# Patient Record
Sex: Male | Born: 1935 | Race: White | Hispanic: No | Marital: Married | Smoking: Never smoker
Health system: Southern US, Community
[De-identification: ages and names within clinical notes are randomized; demographics above are authoritative.]

## PROBLEM LIST (undated history)

## (undated) DIAGNOSIS — I2510 Atherosclerotic heart disease of native coronary artery without angina pectoris: Secondary | ICD-10-CM

## (undated) DIAGNOSIS — S3600XA Unspecified injury of spleen, initial encounter: Secondary | ICD-10-CM

## (undated) DIAGNOSIS — E785 Hyperlipidemia, unspecified: Secondary | ICD-10-CM

## (undated) DIAGNOSIS — E7800 Pure hypercholesterolemia, unspecified: Secondary | ICD-10-CM

## (undated) DIAGNOSIS — I10 Essential (primary) hypertension: Secondary | ICD-10-CM

## (undated) HISTORY — PX: CORONARY STENT PLACEMENT: SHX1402

## (undated) HISTORY — DX: Atherosclerotic heart disease of native coronary artery without angina pectoris: I25.10

## (undated) HISTORY — DX: Pure hypercholesterolemia, unspecified: E78.00

## (undated) HISTORY — PX: THYROID SURGERY: SHX805

## (undated) HISTORY — PX: CHOLECYSTECTOMY: SHX55

---

## 1999-06-12 ENCOUNTER — Inpatient Hospital Stay (HOSPITAL_COMMUNITY): Admission: EM | Admit: 1999-06-12 | Discharge: 1999-06-14 | Payer: Self-pay | Admitting: Emergency Medicine

## 1999-06-12 ENCOUNTER — Encounter: Payer: Self-pay | Admitting: Emergency Medicine

## 1999-12-14 ENCOUNTER — Inpatient Hospital Stay (HOSPITAL_COMMUNITY): Admission: RE | Admit: 1999-12-14 | Discharge: 1999-12-15 | Payer: Self-pay | Admitting: Cardiology

## 2000-12-26 ENCOUNTER — Encounter: Admission: RE | Admit: 2000-12-26 | Discharge: 2000-12-26 | Payer: Self-pay | Admitting: Internal Medicine

## 2000-12-26 ENCOUNTER — Encounter: Payer: Self-pay | Admitting: Internal Medicine

## 2001-02-22 ENCOUNTER — Ambulatory Visit (HOSPITAL_COMMUNITY): Admission: RE | Admit: 2001-02-22 | Discharge: 2001-02-22 | Payer: Self-pay | Admitting: *Deleted

## 2001-02-22 ENCOUNTER — Encounter (INDEPENDENT_AMBULATORY_CARE_PROVIDER_SITE_OTHER): Payer: Self-pay | Admitting: Specialist

## 2001-04-30 ENCOUNTER — Ambulatory Visit (HOSPITAL_COMMUNITY): Admission: RE | Admit: 2001-04-30 | Discharge: 2001-04-30 | Payer: Self-pay | Admitting: *Deleted

## 2002-06-04 ENCOUNTER — Encounter: Payer: Self-pay | Admitting: Otolaryngology

## 2002-06-04 ENCOUNTER — Encounter: Admission: RE | Admit: 2002-06-04 | Discharge: 2002-06-04 | Payer: Self-pay | Admitting: Otolaryngology

## 2002-06-23 ENCOUNTER — Encounter (HOSPITAL_COMMUNITY): Admission: RE | Admit: 2002-06-23 | Discharge: 2002-09-21 | Payer: Self-pay | Admitting: Otolaryngology

## 2002-06-24 ENCOUNTER — Encounter: Payer: Self-pay | Admitting: Otolaryngology

## 2002-07-23 ENCOUNTER — Encounter: Payer: Self-pay | Admitting: Otolaryngology

## 2002-07-23 ENCOUNTER — Encounter (INDEPENDENT_AMBULATORY_CARE_PROVIDER_SITE_OTHER): Payer: Self-pay | Admitting: *Deleted

## 2002-07-23 ENCOUNTER — Ambulatory Visit (HOSPITAL_COMMUNITY): Admission: RE | Admit: 2002-07-23 | Discharge: 2002-07-23 | Payer: Self-pay | Admitting: Otolaryngology

## 2004-06-16 ENCOUNTER — Ambulatory Visit (HOSPITAL_COMMUNITY): Admission: RE | Admit: 2004-06-16 | Discharge: 2004-06-16 | Payer: Self-pay | Admitting: *Deleted

## 2004-06-16 ENCOUNTER — Encounter (INDEPENDENT_AMBULATORY_CARE_PROVIDER_SITE_OTHER): Payer: Self-pay | Admitting: *Deleted

## 2005-03-01 ENCOUNTER — Encounter: Admission: RE | Admit: 2005-03-01 | Discharge: 2005-03-01 | Payer: Self-pay | Admitting: Family Medicine

## 2005-11-20 ENCOUNTER — Ambulatory Visit (HOSPITAL_COMMUNITY): Admission: RE | Admit: 2005-11-20 | Discharge: 2005-11-20 | Payer: Self-pay | Admitting: Family Medicine

## 2006-12-18 ENCOUNTER — Ambulatory Visit (HOSPITAL_COMMUNITY): Admission: RE | Admit: 2006-12-18 | Discharge: 2006-12-18 | Payer: Self-pay | Admitting: *Deleted

## 2007-02-18 ENCOUNTER — Ambulatory Visit (HOSPITAL_COMMUNITY): Admission: RE | Admit: 2007-02-18 | Discharge: 2007-02-18 | Payer: Self-pay | Admitting: *Deleted

## 2007-02-19 ENCOUNTER — Ambulatory Visit (HOSPITAL_COMMUNITY): Admission: RE | Admit: 2007-02-19 | Discharge: 2007-02-19 | Payer: Self-pay | Admitting: Family Medicine

## 2007-03-25 ENCOUNTER — Emergency Department (HOSPITAL_COMMUNITY): Admission: EM | Admit: 2007-03-25 | Discharge: 2007-03-25 | Payer: Self-pay | Admitting: Emergency Medicine

## 2007-03-27 ENCOUNTER — Ambulatory Visit (HOSPITAL_COMMUNITY): Admission: RE | Admit: 2007-03-27 | Discharge: 2007-03-27 | Payer: Self-pay | Admitting: Gastroenterology

## 2007-04-04 ENCOUNTER — Inpatient Hospital Stay (HOSPITAL_COMMUNITY): Admission: AD | Admit: 2007-04-04 | Discharge: 2007-04-09 | Payer: Self-pay | Admitting: Gastroenterology

## 2007-04-07 ENCOUNTER — Encounter (INDEPENDENT_AMBULATORY_CARE_PROVIDER_SITE_OTHER): Payer: Self-pay | Admitting: General Surgery

## 2007-04-09 ENCOUNTER — Encounter (INDEPENDENT_AMBULATORY_CARE_PROVIDER_SITE_OTHER): Payer: Self-pay | Admitting: Interventional Radiology

## 2007-05-01 ENCOUNTER — Observation Stay (HOSPITAL_COMMUNITY): Admission: RE | Admit: 2007-05-01 | Discharge: 2007-05-02 | Payer: Self-pay | Admitting: Otolaryngology

## 2007-05-01 ENCOUNTER — Encounter (INDEPENDENT_AMBULATORY_CARE_PROVIDER_SITE_OTHER): Payer: Self-pay | Admitting: Otolaryngology

## 2008-06-03 ENCOUNTER — Encounter: Admission: RE | Admit: 2008-06-03 | Discharge: 2008-06-03 | Payer: Self-pay | Admitting: Gastroenterology

## 2008-11-22 IMAGING — CR DG ABDOMEN ACUTE W/ 1V CHEST
4 series · 4 of 4 positions shown · non-contrast
Comparison: None.

CLINICAL DATA: Abdominal pain

[w chest pa]
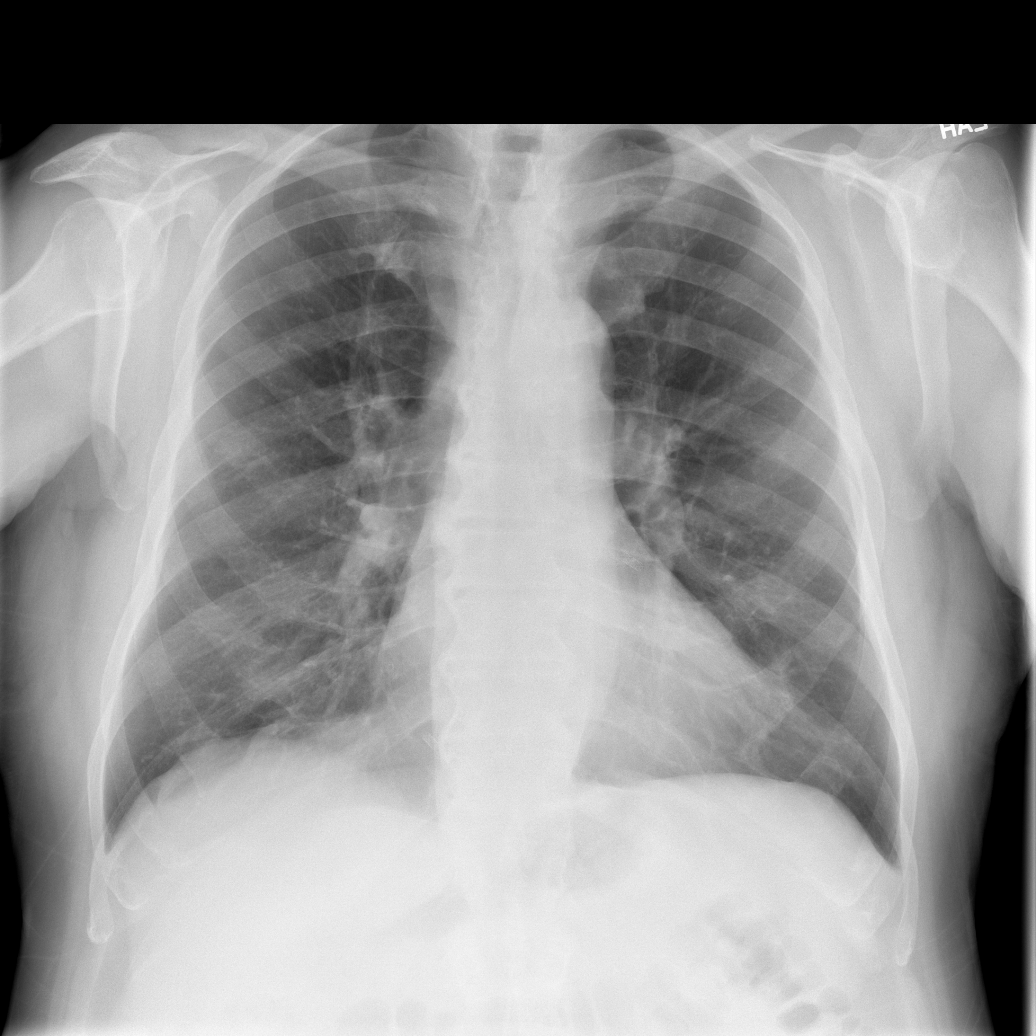

[w abdomen upright]
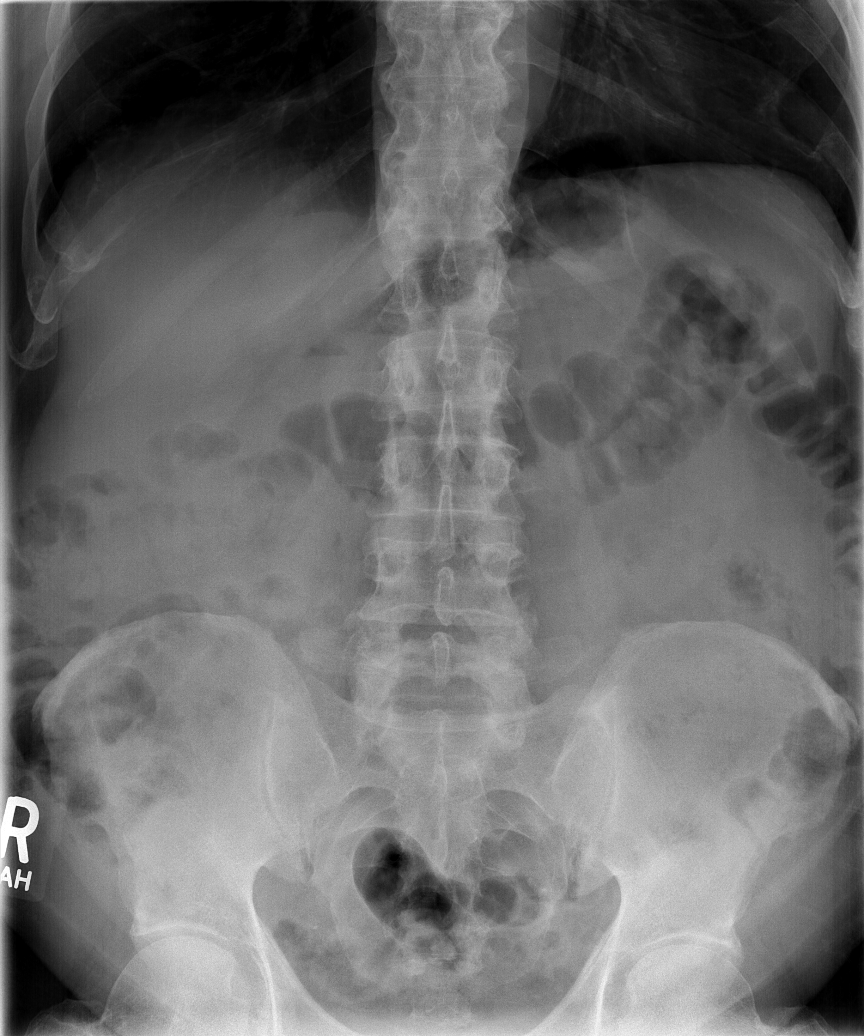

[t abdomen supine (1 of 2)]
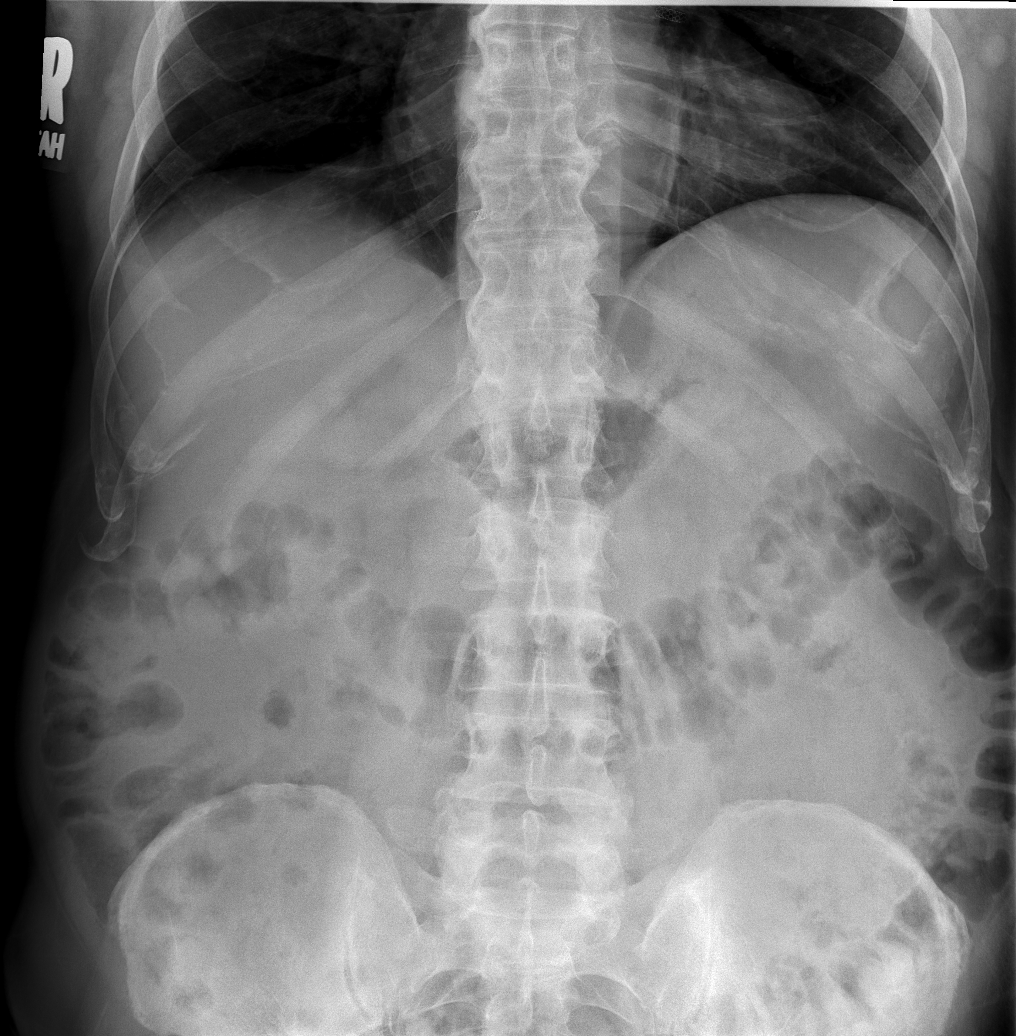

[t abdomen supine (2 of 2)]
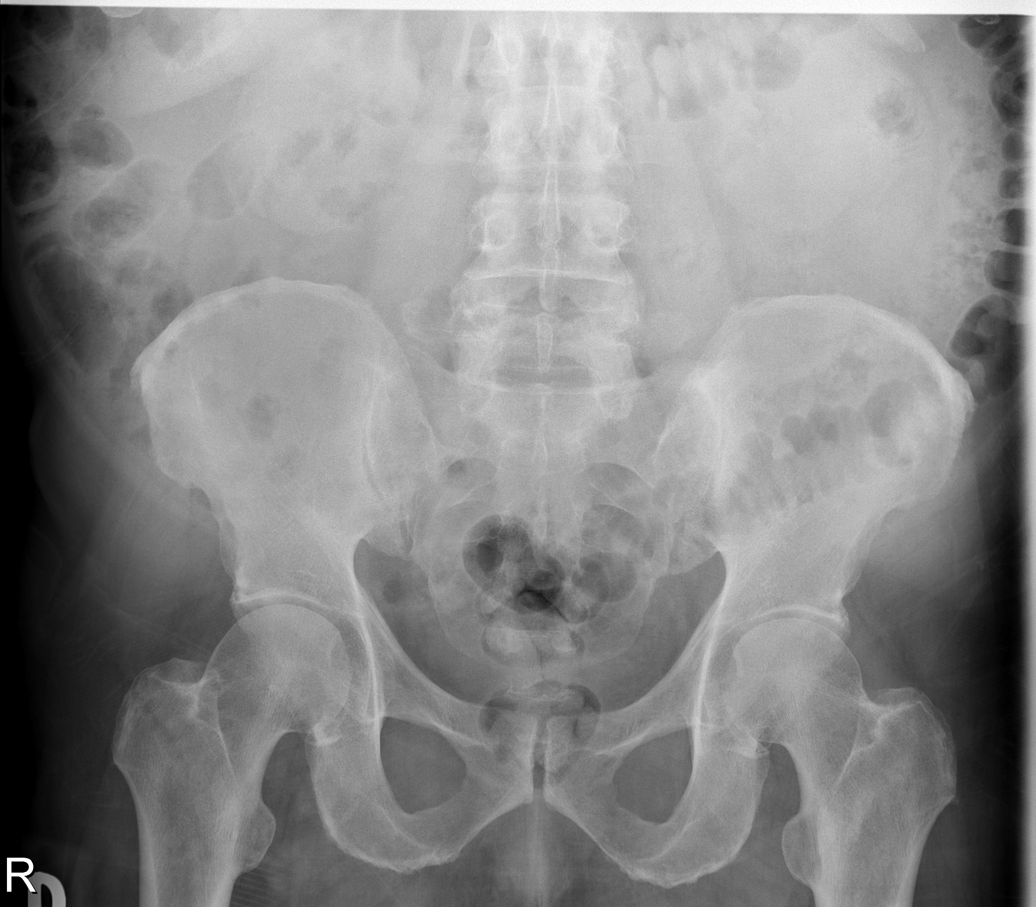

[4 of 4 positions shown; findings below may reference images not displayed]

ABDOMEN SERIES - 2 VIEW AND CHEST - 1 VIEW:

Frontal chest shows a subtle nodular density over the right midlung which is
probably a confluence of shadows. The cardiopericardial silhouette is within
normal limits for size. The lungs are without focal infiltrate, edema or pleural
effusion.

Supine and upright views of the abdomen are without evidence of intraperitoneal
free air. There is no gaseous bowel dilation suggest obstruction. Air is
scattered along the entire length of a nondilated colon. Mild degenerative
changes are evident in lower lumbar spine.
IMPRESSION: Subtle nodular density in the right midlung is probably confluence of shadows. A
followup short-term 2 view chest x-ray is recommended to further evaluate.

Nonspecific bowel gas pattern.

## 2009-10-14 ENCOUNTER — Encounter: Admission: RE | Admit: 2009-10-14 | Discharge: 2009-10-14 | Payer: Self-pay | Admitting: Family Medicine

## 2010-07-31 ENCOUNTER — Encounter: Payer: Self-pay | Admitting: Family Medicine

## 2010-08-01 ENCOUNTER — Encounter: Payer: Self-pay | Admitting: Gastroenterology

## 2010-08-01 ENCOUNTER — Encounter: Payer: Self-pay | Admitting: Otolaryngology

## 2010-10-31 ENCOUNTER — Other Ambulatory Visit: Payer: Self-pay | Admitting: Gastroenterology

## 2010-11-22 NOTE — H&P (Signed)
[name], BARICH NO.:  [id number]   MEDICAL RECORD NO.:  [id number]          PATIENT TYPE:  OBV   LOCATION:  2899                         FACILITY:  [facility]   PHYSICIAN:  Hermelinda Medicus, M.D.   DATE OF BIRTH:  [birth year]   DATE OF ADMISSION:  05/01/2007  DATE OF DISCHARGE:  05/02/2007                              HISTORY & PHYSICAL   This patient is a 77 year old male who was admitted recently to the  hospital for nausea and slight difficulty in swallowing.  He has been  followed by Dr. Madilyn Fireman and ultimately was seen because of also  considerable abdominal pain and underwent a cholecystectomy and did very  well.  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  He also  had a thyroid evaluation when he was there and his TSH was normal at 0.6  but he has had some right-sided discomfort and also the left sided  discomfort and he has had also the hoarseness to his voice which he has  kind of attributed just to his aging.  He has a moderate size swelling  in his neck but this was really not very obvious.  However, he had a CAT  scan of his neck which showed a lesion pressing on the trachea, inferior  thyroid, left, greatest diameter of 4.6 cm.  He had also had an  ultrasound done which showed similar situation and the biopsy was one of  follicular lesion.  He had workup in 2003 and decided to do nothing at  that time but now, with these increased symptoms and increased size of  this, the patient has accepted the fact that we need to remove this  lesion.  He now enters for a left subtotal thyroidectomy.   PAST HISTORY:  Is one really quite unremarkable except for the above-  mentioned stent and the history of reflux esophagitis and cardiovascular  issues.  He has also had the cholecystectomy.  He has also had this  thyroid mass on the left side.  He has also had some choking and nausea  that has been evaluated by Dr. Madilyn Fireman.   MEDICATION LIST:  Is in the chart.   PHYSICAL EXAMINATION:  Reveals a blood pressure of 110/60.  EARS:  Are clear, tympanic membranes are clear and move well.  NOSE:  Is clear.  NASOPHARYNX:  Is also clear.  LARYNX:  Is free of any ulceration or mass.  His vocal cord mobility  just seems a little delayed in this 77 year old man.  Both vocal cords,  however, are moving but he is somewhat hoarse.  His true cords, false  cords, epiglottis, base of tongue, lateral pharyngeal walls, nasopharynx  are all clear of ulceration or masses.  There is no evidence of any  sinus infection, no evidence of any sinusitis.  NECK:  Is free of any thyroid hypertrophy.  The  thyroid is really not  palpable.  The carotids are not abnormal and there is no cervical  adenopathy.  Posterior triangles also clear.  SCALP/SKIN:  Are clear.  LUNGS:  Are clear.  No rales, rhonchi or wheezes.  HEART:  No opening snaps, murmurs or gallops. The EKG is within normal  limits.  ABDOMEN:  No hepatosplenomegaly.  He did have the right upper quadrant  tenderness leading to his gallbladder surgery but this is resolving.  EXTREMITIES:  Are normal.   DIAGNOSES:  1. 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  2. Status post cholecystectomy and status post stents placed, left      anterior descending, right coronary artery.  3. History of ulcer disease.  4. History of hyperlipidemia.  5. Hypertension.           ______________________________  Hermelinda Medicus, M.D.     JC/MEDQ  D:  05/01/2007   T:  05/02/2007  Job:  [id number]   cc:   Reather Littler, M.D.  John C. Madilyn Fireman, M.D.  [facility], Dr. Arlana Pouch A. Nicholos Johns, M.D.

## 2010-11-22 NOTE — Op Note (Signed)
NAMEJEYREN, DANOWSKI NO.:  [id number]   MEDICAL RECORD NO.:  [id number]          PATIENT TYPE:  INP   LOCATION:  6743                         FACILITY:  [facility]   PHYSICIAN:  Sharlet Salina T. Hoxworth, M.D.DATE OF BIRTH:  Dec 03, 1928   DATE OF PROCEDURE:  04/07/2007  DATE OF DISCHARGE:                               OPERATIVE REPORT   PREOPERATIVE DIAGNOSIS:  Chronic cholecystitis/biliary dyskinesia.   POSTOPERATIVE DIAGNOSIS:  Chronic cholecystitis slash biliary  dyskinesia.   SURGICAL PROCEDURES:  Laparoscopic cholecystectomy with intraoperative  cholangiogram.   SURGEON:  Sharlet Salina T. Hoxworth, M.D.   ANESTHESIA:  General.   BRIEF HISTORY:  Mr. Pettry is a 77 year old male who presents with  several weeks to months of gradually worsening right upper quadrant  abdominal pain and nausea.  He has had a very extensive workup, which  has failed to reveal any cause for his pain.  He is clinically suspected  to have chronic cholecystitis or biliary dyskinesia.  After discussion  with the patient's family, they have elected to proceed with  laparoscopic cholecystectomy with cholangiogram, in an effort to relieve  his symptoms.  The nature of the procedure, its indications, risks of  cardiorespiratory complications, bleeding, infection, bile leak, bile  duct injury and possible failure to relieve her symptoms have been  discussed and understood.  The patient is now brought to the operating  room for this procedure.   DESCRIPTION OF OPERATION:  The patient was brought to the operating room  and placed in the supine position on the operating room table.  General  endotracheal anesthesia was induced.  The abdomen was widely and  sterilely prepped and draped.  He received preoperative antibiotics.  PAS were placed.  Correct patient and procedure were verified.  Local  anesthesia was used to infiltrate the trocar sites prior to the  incisions.   A 1 cm incision was made  above the umbilicus in the midline.  The  dissection was carried down to the midline fascia, which was sharply  incised for 1 cm, and the peritoneum entered under direct vision.  Through a mattress sutures of )0 Vicryl, the Hasson trocar was placed  and pneumoperitoneum established.  Under direct vision, an 11 mm trocar  was placed in the subxiphoid area, and two 5 mm trocars along the right  subcostal margin.   The gallbladder was visualized and did not appear severely inflamed; but  was quite distended, large and possibly a little thickened.  The fundus  grasped and elevated up over the liver, and the infundibulum retracted  inferolaterally.  Fibrofatty tissue was stripped off the neck of the  gallbladder toward the porta hepatis.  The cystic artery was clearly  identified coursing up into the gallbladder wall; it was divided between  the two proximal windows to clip.  The distal gallbladder was further  dissected; this was closed triangle, and the cystic duct gallbladder  junction identified.  The cystic duct dissected out over about a  centimeter.  When the anatomy appeared clear, the cystic duct was  clipped at the gallbladder junction and an operative cholangiogram  obtained  through the cystic duct.  This showed good filling of normal  common bile duct and intrahepatic ducts, with free flow into the  duodenum and no filling defects.  Following this, the cholangiocath was  removed and the cystic duct was doubly clipped proximally and divided.  The gallbladder was then dissected free from its bed using hook cautery  and removed through the umbilical incision.  The right upper quadrant  was irrigated and complete hemostasis assured.  Trocars were removed and  all CO2 evacuated.  The mattress suture was secured at the umbilicus.  Skin incisions were closed with interrupted subcuticular Monocryl and  Dermabond.   COUNTS:  Sponge, needle and instrument counts were correct.    DISPOSITION:  The patient was taken to the recovery room in good  condition.      Lorne Skeens. Hoxworth, M.D.  Electronically Signed     BTH/MEDQ  D:  04/07/2007  T:  04/07/2007  Job:  [id number]

## 2010-11-22 NOTE — Discharge Summary (Signed)
[name]            ACCOUNT NO.:  [id number]   MEDICAL RECORD NO.:  [id number]          PATIENT TYPE:  INP   LOCATION:  6743                         FACILITY:  [facility]   PHYSICIAN:  John C. Madilyn Fireman, M.D.    DATE OF BIRTH:  12/25/1928   DATE OF ADMISSION:  04/04/2007  DATE OF DISCHARGE:  04/09/2007                               DISCHARGE SUMMARY   ADMISSION DIAGNOSIS:  Abdominal pain, vomiting and weight loss.   DISCHARGE DIAGNOSES:  1. Biliary dyskinesia, status post cholecystectomy.  2. Thyroid mass, status post biopsy.  3. Hypertension.  4. Hyperlipidemia.  5. History of ulcer disease.  6. Coronary artery disease, status post stent placement in the left      anterior descending and right coronary artery years ago.   PRIMARY CARE PHYSICIAN:  Robert A. Nicholos Johns, M.D.   CONSULTATIONS:  1. [facility] on September 27 with Dr. Jaclynn Guarneri.  2. [facility] on September 29 Dr. Reather Littler.   BRIEF HISTORY:  This is a 77 year old gentleman who has a history of  chronic abdominal symptoms.  He has had an extensive outpatient workup  including a recent endoscopy and colonoscopy which were negative, a  recent gastric emptying scan and HIDA scan which were normal, a CT of  the abdomen which was unremarkable for a source of the patient's  symptoms.  On September 25 [name]  came to the [facility] GI office and  was seen by Dr. Randa Evens.  He was complaining of a 20-pound weight loss  in the past 3 weeks, nausea, vomiting all p.o.'s including water, having  right upper quadrant pain that he says radiated through to his back.  He  was also complaining of cramps in his lower legs and felt extremely weak  and dizzy to the point that he was having trouble walking.  Dr. Randa Evens  directed him to [facility] Emergency Room and he was admitted  by Dr. Dorena Cookey.  On admission, he mentioned to me that he had a  thyroid mass and was supposed to have an  outpatient thyroid ultrasound  in just a few days.   LABORATORY DATA:  Labs on admission week hemoglobin 13.7, hematocrit  40.3, white count 7.3, platelets 163,000.  Chem-7 showed a potassium of  3.1, creatinine of 1.67, BUN 19.  Liver function tests were within  normal limits.  Amylase was 105, lipase 37.  During the course of his  hospital stay,  he had a tissue transglutaminase there was 0, a TSH that  came back 0.585, CA19-9 that came back 18.   PROBLEM LIST:  1. Abdominal pain.  2. Thyroid mass.  3. Hypokalemia.   With regards to the abdominal pain, as mentioned earlier, extensive  outpatient workup was done that was negative.  An abdominal ultrasound  was ordered that was negative as well.  Small bowel follow-through was  done and was normal as well.  The patient's symptoms continued and he  described a strong family history of gallbladder disease.  Consequently  [facility] was called.  Dr. Jaclynn Guarneri saw the patient  on September 27 and felt that given the patient's history and ongoing  symptoms, he would  benefit from a cholecystectomy.  Laparoscopic  cholecystectomy which was performed on September 28 with an  intraoperative cholangiogram that was normal.  Gallbladder pathology is  benign.  The patient will follow up with [facility] in 2  weeks.   With regards to the thyroid mass, the patient's TSH during this  admission was 0.585, within normal limits.  Also ordered was a thyroid  peroxidase which was very elevated at 896.  The thyroid ultrasound that  was originally ordered as an outpatient by Dr. Haroldine Laws was done as an  inpatient and revealed an enlarged left thyroid load as well as a large  solid mass that was at least 4.6 cm.  Dr. Reather Littler was called to  consult and followed the patient on September 29.  His impression was  that there is a large left thyroid mass.  The patient was euthyroid with  a TSH of 0.58.  Positive antibodies  reflect autoimmune disease which is  also strong in the patient's family history; however, not clinically  significant for him.  Dr. Lucianne Muss then ordered a needle biopsy of the  nodule.  Needle biopsy was done on September 30 by interventional  radiology.  Using a 22 gauge needle, they were able to secure 3 good  samples of tissue.  1. With regards to the hypokalemia, the patient's potassium was      repleted during this admission and his leg cramps resolved.   I saw the patient this evening on September 30 after his thyroid biopsy.  He was feeling well.  He had had several meals of solid foods and was  tolerating them without difficulty.  He was able to ambulate around the  room without dizziness or pain.   PHYSICAL EXAMINATION:  GENERAL:  He was alert and oriented, in no  apparent distress.  HEART:  His heart had a regular rate and rhythm.  LUNGS:  Clear to auscultation.  ABDOMEN:  Soft, nontender.  It was mildly distended and he had good  bowel sounds.  His incisions from laparoscopic surgery appeared to be  healing well.   The patient was requesting discharge.   Final set of labs done in the hospital on September 29 showed an AST  slightly elevated at 114, ALT 185, alk phos of 90, total bilirubin 0.9,  amylase of 556, lipase of 24.  Dr. Madilyn Fireman saw the patient this evening  and it was determined that he was ready for discharge.  He was  discharged to home in good condition with follow-up appointments.   FOLLOW UP:  1. With Dr. Jaclynn Guarneri in 1-2 weeks at [facility].  2. With Dr. Haroldine Laws in 1-2 weeks.  3. With Dr. Dorena Cookey at [facility] on October 28 at 1:15 p.m. to check      on his symptoms as well as to follow up on his elevated LFTs and      amylase.   DISCHARGE MEDICATIONS:  1. Prilosec OTC once a day.  2. Crestor 10 mg once a day.  3. Benicar/HCTZ once a day.  THE  4. He patient was instructed to hold his aspirin for 5 days.  5. He is allowed to resume  his probiotics once a day.   OTHER DISCHARGE INSTRUCTIONS:  Other discharge instructions included all  his Steri-Strips just to fall off on their own, not driving car for 2  weeks, to advance activity slowly.  No lifting for 2 weeks and to call  [facility] office with any increase in pain or return of his symptoms      Stephani Police, PA    ______________________________  Everardo All Madilyn Fireman, M.D.    MLY/MEDQ  D:  04/09/2007  T:  04/10/2007  Job:  [id number]   cc:   Reather Littler, M.D.  Lorne Skeens. Hoxworth, M.D.  Hermelinda Medicus, M.D.  Robert A. Nicholos Johns, M.D.

## 2010-11-22 NOTE — Consult Note (Signed)
[name], [name] NO.:  [id number]   MEDICAL RECORD NO.:  [id number]          PATIENT TYPE:  INP   LOCATION:  6743                         FACILITY:  [facility]   PHYSICIAN:  Sharlet Salina T. Hoxworth, M.D.DATE OF BIRTH:  [birth year]   DATE OF CONSULTATION:  04/06/2007  DATE OF DISCHARGE:                                 CONSULTATION   CHIEF COMPLAINT:  Abdominal pain, nausea, weight loss.   HISTORY OF PRESENT ILLNESS:  [name] is a very pleasant 77 year old  white male admitted to [facility] by Dr. Dorena Cookey.  I was  asked to evaluate the patient today by Dr. Matthias Hughs.  Mr. Tonkinson gives a  fairly long history of progressively worsening abdominal and GI  complaints.  He has had some episodic problems for, actually, a number  of years.  His main symptoms consist of nausea, gas, bloating and  abdominal pain.  He states that about four years ago, he had an acute  episode of abdominal pain and was admitted to the hospital and told he  would need gallbladder surgery but improved and was discharged.  He had  an episode of severe upper abdominal pain in [place] about four years  ago and was briefly admitted and then discharged.  He continued to have  episodic upper abdominal gas, bloating and nausea, but nothing that was  an extremely major problem for him until this summer.  At that time, he  began experiencing gradually worsening symptoms.  The patient has seen  Dr. Virginia Rochester in the past and came in for routine scheduled colonoscopy this  summer which was negative.  Due to his ongoing abdominal symptoms, he  also underwent upper GI endoscopy and verbal report from the patient was  this was entirely normal.  He also is followed by Dr. Nicholos Johns for his  primary care and did test positive for H. pylori and was treated for  this, but did not have improvement.  He was also tried empirically on  proton pump inhibitors without improvement.  More recently, over the  last six  weeks or so, his symptoms have become much worse and on a daily  basis.  He describes post prandial pressure, bloating, gas and belching,  also nausea with rare vomiting.  He describes pressure like abdominal  pain, occasionally severe, which he describes as epigastrium and right  upper quadrant radiating through to his back.  He recently saw Dr. Madilyn Fireman  and has had a very thorough evaluation.  CT scan of the abdomen and  pelvis was obtained at [facility] and was unremarkable for  any source of his pain.  There is a note on the clinical history of this  exam that he did have an elevated amylase at some point in Dr. Benjaman Pott  office.  The patient then subsequently has had an abdominal ultrasound  that was entirely normal, specifically no gallstones, normal  gallbladder, and normal common bile duct.  He had a nuclear medicine  biliary scan with ejection fraction just over a month ago that was  normal.  He underwent a nuclear medicine gastric emptying study  on  September 17 with excellent gastric emptying.  The patient presented to  Dr. Madilyn Fireman' office several days ago with persistent and worsening pain  which he felt was intolerable and was admitted for further evaluation  and treatment.  Since admission, he has had a normal abdominal  ultrasound noted above as well as an upper GI and small bowel series  which was negative.  The patient has had about a 20 pounds weight loss  over the last two months due to his symptoms.  He feels they are really  intolerable at this point.  He has not had any fever or chills.  No  jaundice noted.  Bowel movements have been generally unremarkable.   PAST MEDICAL HISTORY:  Previous surgery includes pilonidal cyst removal  and tonsillectomy.  Medically significant for a history of coronary  artery disease status post stent placement with no recent issues on  follow up. Also mild hypertension and elevated cholesterol.  He has been  told by Dr. Gerre Pebbles a  number of years ago that he had ulcers but I do  not believe there is any documentation of this.   MEDICATIONS ON ADMISSION:  Benicar/HCT 40/12.5 daily, Crestor 10 daily,  aspirin 325 q.a.m.   ALLERGIES:  None.   SOCIAL HISTORY:  He is retired from the [employer]. Married.  His  mother had gallbladder disease, apparently without stones, relieved with  cholecystectomy.   REVIEW OF SYSTEMS:  GENERAL:  Positive for weight loss, no fever or  chills.  RESPIRATORY:  Denies shortness of breath, cough, history of  pulmonary disease.  CARDIAC:  No recent chest pain or palpitations.  GI:  As above.  GU:  No urinary burning or frequency.   PHYSICAL EXAMINATION:  VITAL SIGNS:  Temperature is 99, pulse 72,  respirations 21, blood pressure 113/63.  GENERAL:  Well developed male no acute distress.  SKIN:  Warm and dry.  No rash or infection.  HEENT:  No mass or thyromegaly.  Sclerae nonicteric.  LUNGS:  Clear without wheezing or increased work of breathing.  CARDIAC:  Regular rhythm.  No murmurs, no JVD or edema.  ABDOMEN:  Mild distention.  Bowel sounds are active.  There is definite  epigastric and right upper quadrant tenderness and actually some slight  guarding.  No palpable masses or hepatosplenomegaly noted. No herniae.  EXTREMITIES:  No joint swelling or deformity.  NEUROLOGIC:  Alert and oriented. Gait normal.  Motor and sensory exams  grossly normal.   LABORATORY DATA:  CBC unremarkable, white count 5.6, hemoglobin 12.2,  platelets were 140.  Complete metabolic panel abnormal only for a  glucose of 107, total protein 5.4, and albumin of 3.1, remainder of LFTs  normal. Lipase normal at 37, amylase normal at 105./.   IMAGING:  As detailed above.   ASSESSMENT/PLAN:  77 year old male with progressively worsening GI  complaints of nausea and abdominal pain which is consistent with biliary  tract disease.  He has had an extremely thorough negative workup with no  distinct  abnormalities noted.  His symptoms do suggest biliary tract  disease and with persistence and severity of symptoms and, otherwise,  negative workup, I believe that a cholecystectomy for possible biliary  dyskinesia or acalculous cholecystitis or undiagnosed small gallstones  would be very reasonable.  I discussed this with the patient and his  wife.  They understand that there is no firm diagnosis here and the  results of surgery are uncertain as far as symptom relief.  The nature  of the surgery, the risks of bleeding, infection, cardiorespiratory  complications, bile duct injury and bile leakage were discussed and  understood.  They felt strongly they would like to proceed in an effort  to relieve his symptoms.  We will plan to proceed with laparoscopic  cholecystectomy with cholangiogram this hospitalization.      Lorne Skeens. Hoxworth, M.D.  Electronically Signed     BTH/MEDQ  D:  04/06/2007  T:  04/06/2007  Job:  [id number]   cc:   Everardo All. Madilyn Fireman, M.D.  Robert A. Nicholos Johns, M.D.

## 2010-11-22 NOTE — H&P (Signed)
[name], [name]            ACCOUNT NO.:  [id number]   MEDICAL RECORD NO.:  [id number]          PATIENT TYPE:  INP   LOCATION:  6743                         FACILITY:  [facility]   PHYSICIAN:  James L. Malon Kindle., M.D.DATE OF BIRTH:  [birth year]   DATE OF ADMISSION:  04/04/2007  DATE OF DISCHARGE:                              HISTORY & PHYSICAL   REASON FOR ADMISSION:  Abdominal pain, nausea and vomiting and  dehydration.   PRIMARY CARE PHYSICIAN:  Robert A. Nicholos Johns, M.D.   GASTROENTEROLOGIST:  Everardo All. Madilyn Fireman, M.D.   HISTORY:  This is a 77 year old who had had chronic vague abdominal  symptoms some time.  He has previously seen by Dr. Sabino Gasser and  underwent an endoscopy and colonoscopy in August 11of this year.  Both  were negative other than internal hemorrhoids.  He has had abdominal  pain. Has been on Prilosec.  Has not really helped.  Has had increased  burping, borborygmi and bloating.  There was a questionable history of  admission in the past for a small bowel obstruction and it got better.  He saw Dr. Madilyn Fireman in the office on September 12 with this history.  He  had lost 10 or 12 pounds.  At this point he had a CT scan, a HIDA scan  that showed his gallbladder was okay apparently.  He had continued  symptoms.  He has been unable to eat and continued to lose weight.  He  has not really had any diarrhea.  He called up with continued  complaints.  Dr. Madilyn Fireman had order an ultrasound.  It appears he has not  as of yet had an ultrasound.  This was ordered in the next several days.  The patient comes into the office with his wife as urgent work-in today.  He is having severe symptoms.  He is still continuing to have severe  right upper quadrant pain going through to the back.  He has been unable  to eat.  Even water makes him sick.  He has dry heaves at times.  He has  been able to keep down a mouth full of O Bran cereal and a sip or two of  water in the past 24 hours.  The pain  seems to be getting worse.  He has  not had any fever or chills.  Has taken his blood pressure medicine.  This has made him exceedingly weak and dizzy.  He has lost 10 pounds  since September 12 documented in our chart.  He notes a 20-pound weight  loss over the past 3 weeks due to inability to eat.  What is of concern  is he feels at this point like he is nearly passing out when he stands  and is having cramps in his lower legs up to the point that he has  hardly been able to walk.   CURRENT MEDICATIONS:  1. Benicar/HCT 40/12.5 q.a.m.  2. Crestor 10 mg q.p.m.  3. Aspirin 325 q.a.m.  4. The patient will continue B12, folic acid, multivitamins.   ALLERGIES:  Has no drug allergies.   MEDICAL HISTORY:  1. Has a history of coronary disease.  Has several stents in place.      Apparently his heart is doing fine.  2. Hypertension.  3. Ulcers in the past.  4. High cholesterol.   PAST SURGICAL HISTORY:  1. Removal of pilonidal cyst.  2. Tonsillectomy.  3. Never any abdominal surgery.   FAMILY HISTORY:  Family history is negative for colon cancer or polyps.   SOCIAL HISTORY:  Is married. With his wife today.  Has retired, does not  smoke or drink.   REVIEW OF SYSTEMS:  Remarkable for lack of heartburn, indigestion.  There was apparently some hospitalization in the distant past with  question of a bowel obstruction.  Apparently that got better.  I think  he was seen in the ER for that several years ago.  Thought it might have  been a virus.  He is having no diarrhea.  No rectal bleeding.  He has  had the recent endocolonoscopy.   PHYSICAL EXAMINATION:  VITAL SIGNS:  Weight 189, down 9 pounds.  Temperature 98.6, pulse 84, blood pressure 90/50.  GENERAL:  Very ill-appearing white male.  HEENT:  Sclerae nonicteric  Mucous membranes are very dry.  LUNGS:  Clear.  HEART:  Regular rate and rhythm. No murmurs or gallops.  ABDOMEN:  Nondistended with normal bowel sounds to diminished  bowel  sounds.  Exquisite tenderness in the right upper quadrant and severe  positive Murphy's sign.   ASSESSMENT:  1. Worsening right upper quadrant pain going to the back, nausea and      vomiting, all  consistent with gallstone.  He may be getting      pancreatitis.  2. Probable dehydration, electrolyte abnormalities.   PLAN:  Will admit the patient to the hospital, give IV fluids, obtain an  acute abdominal series, ultrasound and routine labs.           ______________________________  Llana Aliment Malon Kindle., M.D.     Waldron Session  D:  04/04/2007  T:  04/05/2007  Job:  [id number]

## 2010-11-22 NOTE — Op Note (Signed)
NAME:  [name], [name] NO.:  [id number]   MEDICAL RECORD NO.:  [id number]          PATIENT TYPE:  AMB   LOCATION:  ENDO                         [facility]ACILITY:  [facility]   PHYSICIAN:  Georgiana Spinner, M.D.    DATE OF BIRTH:  1929-06-03   DATE OF PROCEDURE:  02/18/2007  DATE OF DISCHARGE:                               OPERATIVE REPORT   PROCEDURE:  Upper endoscopy.   INDICATIONS:  Abdominal pain.   ANESTHESIA:  Fentanyl 50 mcg, Versed 4 mg.   DESCRIPTION OF PROCEDURE:  With the patient mildly sedated in the left  lateral decubitus position the Pentax videoscopic endoscope was inserted  in the mouth, passed under direct vision through the esophagus which  appeared normal, into the stomach fundus, body, antrum, duodenal bulb,  second portion duodenum were visualized.  From this point the endoscope  was slowly withdrawn taking circumferential views of duodenal mucosa  until the endoscope had been pulled back into the stomach, placed in  retroflexion, to view the stomach from below.  The endoscope was  straightened and withdrawn taking circumferential views of remaining  gastric and esophageal mucosa.  The patient's vital signs, pulse  oximeter remained stable.  The patient tolerated procedure well without  apparent complications.   FINDINGS:  Normal examination.   PLAN:  Have patient follow up with me as an outpatient.           ______________________________  Georgiana Spinner, M.D.     GMO/MEDQ  D:  02/18/2007  T:  02/19/2007  Job:  [id number]   cc:   Molly Maduro A. Nicholos Johns, M.D.  Fax: [phone]

## 2010-11-22 NOTE — Consult Note (Signed)
NAMENICKOLIS, DIEL NO.:  [id number]   MEDICAL RECORD NO.:  [id number]          PATIENT TYPE:  INP   LOCATION:  6743                         FACILITY:  [facility]   PHYSICIAN:  Reather Littler, M.D.       DATE OF BIRTH:  04-28-1929   DATE OF CONSULTATION:  04/09/2007  DATE OF DISCHARGE:  04/09/2007                                 CONSULTATION   REASON FOR CONSULTATION:  Thyroid evaluation.   HISTORY:  This 77 year old Caucasian male who was admitted about 5 days  ago for abdominal pain and nausea was being evaluated at the same time  for thyroid problems because of his poor appetite.  Thyroid levels and  antibodies were checked. His antibody level with the TPO antibody was  increased at 896, his TSH was normal at 0.6.   The patient had apparently been having problems with sore throat,  right-  sided ear discomfort and some feeling of a lump in the throat for about  3-4 weeks. The patient has been evaluated by his ENT surgeon and  initially no other diagnosis except pharyngitis was made but  subsequently the patient was sent for a CT scan of his neck because of  his symptoms of lump in the neck. Apparently the CT scan showed a  thyroid mass but the report is not available. The patient was supposed  to have an ultrasound of the thyroid and this was done in the hospital.  This showed a large 4.6-cm mass in the left lower lobe and further  evaluation was needed.   Currently the patient has no difficulty swallowing, any choking  sensation and he does not have any further sensations of lumps in his  throat.  The patient currently is improving from his recent problem of  abdominal pain and vomiting.   FAMILY HISTORY:  The patient has a very strong family history of thyroid  disease. Apparently has mother and sister and also daughters have  various thyroid problems, including thyroid nodules and autoimmune  thyroid disease with hypothyroidism. The patient has no other  significant family history.   PAST HISTORY:  The patient has had a recent cholecystectomy   REVIEW OF SYSTEMS:  The patient has no history of diabetes.   Currently his medication list is unavailable.   PHYSICAL EXAMINATION:  The patient is alert and cooperative. He is  sitting without any acute distress.  The patient has normal blood pressure and vital signs today.  HEENT:  Eyes externally normal. Mucous membranes are normal.  NECK:  Thyroid is not palpable. His carotids are not abnormal in  character and no bruit present.  HEART:  Normal sounds.  LUNGS:  Clear.  ABDOMEN:  No hepatosplenomegaly.  He has mild right upper quadrant  tenderness.  EXTREMITIES:  Normal with no skin rash or edema.  Deep tendon reflexes  are normal.   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 Since the patient is euthyroid with a normal TSH, this needs  to be evaluated  with a biopsy as it is likely to be nonfunctional. This  will be scheduled today.  The patient was reassured that he does not  have any thyroid dysfunction and his positive antibody test may be  related to his strong family history of autoimmune disease   Thank you for the consultation, will follow.      Reather Littler, M.D.  Electronically Signed     AK/MEDQ  D:  04/10/2007  T:  04/10/2007  Job:  [id number]   cc:   Everardo All. Madilyn Fireman, M.D.  Hermelinda Medicus, M.D.

## 2010-11-22 NOTE — Op Note (Signed)
NAME:  [name], [name] NO.:  [id number]   MEDICAL RECORD NO.:  [id number]          PATIENT TYPE:  AMB   LOCATION:  ENDO                         FACILITY:  [facility]   PHYSICIAN:  Georgiana Spinner, M.D.    DATE OF BIRTH:  04/13/1929   DATE OF PROCEDURE:  12/18/2006  DATE OF DISCHARGE:                               OPERATIVE REPORT   PROCEDURE:  Colonoscopy.   ENDOSCOPIST:  Georgiana Spinner, M.D.   INDICATIONS FOR PROCEDURE:  A history of colon polyps.   ANESTHESIA:  Fentanyl 60 mcg, Versed 6 mg.   DESCRIPTION OF PROCEDURE:  With the patient mildly sedated, in the left  lateral decubitus position, the Pentax videoscopic colonoscope was  inserted into the rectum, and passed under direct vision to the cecum  identified by the ileocecal valve and appendiceal orifice, both of which  were photographed.  From this point the colonoscope was slowly  withdrawn, taking circumferential views of the colonic mucosa, stopping  only in the rectum, which appeared normal and showed hemorrhoids on  retroflexed view.  The endoscope was straightened and withdrawn.  The  patient's vital signs and pulse oximeter remained stable.   The patient tolerated the procedure well without apparent complications.   FINDINGS:  Internal hemorrhoids, otherwise unremarkable colonoscopic  examination, other than scattered diverticula in the sigmoid colon.   PLAN:  A repeat examination possibly in five years.           ______________________________  Georgiana Spinner, M.D.     GMO/MEDQ  D:  12/18/2006  T:  12/18/2006  Job:  [id number]   cc:   Molly Maduro A. Nicholos Johns, M.D.  Fax: [phone]

## 2010-11-22 NOTE — Discharge Summary (Signed)
NAMEMACALISTER, ARNAUD            ACCOUNT NO.:  [id number]   MEDICAL RECORD NO.:  [id number]          PATIENT TYPE:  INP   LOCATION:  6743                         FACILITY:  [facility]   PHYSICIAN:  James L. Randa Evens, M.D. DATE OF BIRTH:  February 27, 1929   DATE OF ADMISSION:  04/04/2007  DATE OF DISCHARGE:  04/09/2007                               DISCHARGE SUMMARY   ADMISSION DIAGNOSES:  1. Abdominal pain.  2. Vomiting.  3. Weight loss.   DISCHARGE DIAGNOSES:  1. Biliary dyskinesia status post cholecystectomy.  2. Thyroid nodule, status post thyroid biopsy.  3. Hypertension.  4. High cholesterol.  5. History of ulcers.   SERVICE:  Gastroenterology.   CONSULTATIONS:  1. [facility] Surgery, Dr. Jaclynn Guarneri, who saw the patient on      September 27.  2. Endocrinology, Dr. Lucianne Muss, who saw the patient on September 29.  3. While the patient was hospitalized, he also had a nutrition      consult.   PROCEDURES:  1. The patient had an laparoscopic cholecystectomy for nausea and      vomiting on September 28 with an intraoperative cholangiogram. The      cholangiogram was normal.  Pathology of the gallbladder showed no      atypia or evidence of malignancy. Pathology is currently.  2. Left thyroid nodule ultrasound-guided biopsy done by interventional      radiology April 09, 2007. There were no complications with the      biopsy. Pathology is pending.   BRIEF HISTORY AND PHYSICAL:  This is a 77 year old gentleman who had  chronic vague abdominal symptoms over a long period of time. He had  previously seen Dr. Sabino Gasser and had an endoscopy and colonoscopy,  both were negative other than internal hemorrhoids. He presented to the  [facility] GI office on September 25 and saw Dr. Carman Ching. He noted a 20-  pound weight loss over 3 weeks as well as the inability to eat any  solids without vomiting. Dr. Randa Evens states that he was nearly passing  out when standing and having severe  cramps in his lower legs. On  admission, his amylase and lipase were found to be normal. CBC was  within normal limits.  Comprehensive metabolic panel showed potassium of  3.1, BUN 19, creatinine 1.67. The patient had a gastric emptying study  done on September 17 which showed excellent gastric emptying. An  abdominal ultrasound done on September 25 showed no acute finding. Upper  GI series with small-bowel follow-through showed no gastric  abnormality, negative small bowel follow-through, normal terminal ileum,  faint reflux.  Due to the patient's negative GI workup, family history  of gallbladder disease and continuation of symptoms, a surgery consult  was requested and Dr. Jaclynn Guarneri saw the patient on September 28.      Stephani Police, PA    ______________________________  Llana Aliment Randa Evens, M.D.    MLY/MEDQ  D:  04/09/2007  T:  04/10/2007  Job:  [id number]

## 2010-11-22 NOTE — Op Note (Signed)
NAMEMYRICK, MCNAIRY NO.:  [id number]   MEDICAL RECORD NO.:  [id number]          PATIENT TYPE:  OBV   LOCATION:  2899                         FACILITY:  [facility]   PHYSICIAN:  Hermelinda Medicus, M.D.   DATE OF BIRTH:  10/01/1928   DATE OF PROCEDURE:  05/01/2007  DATE OF DISCHARGE:  05/02/2007                               OPERATIVE REPORT   PREOPERATIVE DIAGNOSIS:  Left thyroid mass, increasing in size, pressure  of trachea and esophagus, with follicular needle biopsy of 4.6 cm in  greatest diameter.   POSTOPERATIVE DIAGNOSIS:  Left thyroid mass, increasing in size,  pressure of trachea and esophagus, with follicular needle biopsy of 4.6  cm in greatest diameter.   OPERATION:  Left subtotal thyroidectomy.   OPERATOR:  Hermelinda Medicus, MD   ASSISTANT:  Narda Bonds, MD   ANESTHESIA:  General endotracheal.   ANESTHESIOLOGIST:  Bedelia Person, M.D.   PROCEDURE:  The patient was placed in supine position and under general  orotracheal anesthesia, a shoulder roll was placed and the head was  back, gently extended to the midline.  The patient was prepped and  draped in the appropriate manner using Betadine x3 in the usual neck  drape.  Once this was completed, a thyroid incision was made as low as  possible because this was a very low lesion which was extending down in  the substernal and subclavicular region on the left side.  The strap  muscles were reflected laterally and the superior and inferior flaps of  the platysma were retracted and the thyroid retractor was used to hold  these superiorly and inferiorly.  The isthmus was seen and this was  cross-cut, separating it from the normal right thyroid and then this was  separated down off the trachea, dissecting the medial anterior aspect of  the thyroid.  We then dissected inferiorly and superiorly.  We  immediately found the tumor mass inferiorly and superiorly, we dissected  away from the Berry's ligament, as this  was very tight and very  hemorrhagic, but we dissected away from this; this was normal thyroid,  so we kept a conservative bent in this region.  Superior thyroid vessels  were taken using the cross-clamping and crosscut and tying with 2-0 silk  suture.  We dissected around the superior and then more posterior aspect  and began to develop this lesion from its posterior extension, which was  way back to the esophagus, past the tracheoesophageal groove and then  one area was essentially approaching the cervical spine; we developed  this tissue also from the inferior aspect and we were able to develop  this from as its deepest exposure down into the substernal region.  We  dissected inferiorly and medially along the trachea, again being very  careful to guard the recurrent nerve, and we were able to achieve this,  taking the inferior thyroid vessels, crossclamping and tying with 2-0  silk.  The thyroid itself was quite small; the lesion was the larger  area.  By blunt and sharp dissection and by Bovie electrocoagulation  dissection, we were able to separate this  from the normal tissues  without a great deal of hemorrhagic problem.  The lesion was just  separated down to the tracheoesophageal groove very carefully.  The  recurrent nerve was seen, but not really dissected out and then the  entire lesion was removed.  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  The remainder of the tissue will be sent for permanent section.  We saw no other nodes; we removed all that were associated with the  thyroid and hemostasis was then carefully established by examining the  superior, mid and inferior aspect of the area where we dissected along  with the tracheoesophageal groove back posteriorly and inferiorly along  the trachea itself and superiorly again  up by Berry's ligament using the  Bovie coagulation and the bipolar cautery.  A piece of Surgicel was also  placed within this area.  The #7 Jackson-Pratt drain was also placed.  After everything was completely dry, the retractors were removed.  Again, the area was carefully checked and examined and found to be  completely dry.  Closure was begun using 2-0 chromic catgut and then the  drain was sutured in with 2-0 silk and the closure was completed with 2-  0 chromic and then 5-0 Ethilon.  The patient is aware of the risks and  gains.  He will have the drain in overnight.  He will be kept overnight  because any potential bleeding could affect his airway and that he needs  to be very careful at home for several days.  He is aware of the risks  and gains of infection, bleeding and having other medical issues.  The  patient tolerated the procedure very well and has done very well  postoperatively and will be followed up in the hospital as a 23-hour  observation and then will be in 5 days, 10 days, 3 weeks, 6 weeks, 3  months, 6 months and a year.           ______________________________  Hermelinda Medicus, M.D.     JC/MEDQ  D:  05/01/2007  T:  05/02/2007  Job:  [id number]   cc:   Cristal Deer E. Ezzard Standing, M.D.  John C. Madilyn Fireman, M.D.  Lorne Skeens. Hoxworth, M.D.  Reather Littler, M.D.

## 2010-11-25 NOTE — Procedures (Signed)
[facility]  Patient:    [name], [name]                   MRN: [id number] Proc. Date: 02/22/01 Adm. Date:  [id number] Attending:  Sabino Gasser                           Procedure Report  PROCEDURE:  Upper endoscopy with Savary dilation and biopsy.  INDICATIONS:  Dysphagia.  ANESTHESIA:  Demerol 50 mg, Versed 6 mg.  DESCRIPTION OF PROCEDURE:  With the patient mildly sedated in the left lateral decubitus position, the Olympus videoscopic endoscope was inserted into the mouth and passed under direct vision through the esophagus, which appeared normal, into the stomach which appeared mildly erythematous to duodenum which appeared normal.  From this point, the endoscope was slowly withdrawn, taking circumferential views of the entire duodenal mucosa visualized until the endoscope was pulled back into the stomach and placed in retroflexion to view the stomach from below.  This appeared normal.  The endoscope was then straightened and withdrawn taking circumferential views of the entire gastric mucosa.  The endoscope was then reinserted to the distal stomach, and a guidewire was passed.  Over the guidewire, an 18 Savary dilator was passed easily.  The endoscope was then reinserted.  After the guidewire and dilator had been removed, we entered back into the stomach and reviewed the remainder of the stomach which was biopsied because of the erythema and viewed the esophagus which appeared normal.  The patients vital signs and pulse oximeter remained stable.  The patient tolerated the procedure well without apparent complications.  FINDINGS:  Mild erythema of the stomach, possibly a mild small hiatal hernia, otherwise unremarkable examination.  PLAN:  Will await biopsy reports and assess clinical response to the dilation. Move on to get esophageal manometry done as well.  Have patient follow up with as an outpatient. DD:  02/22/01 TD:  02/22/01 Job:  [id number]

## 2010-11-25 NOTE — Cardiovascular Report (Signed)
[place]  Patient:    [name], [name]                   MRN: [id number] Proc. Date: 12/14/99 Adm. Date:  [id number] Disc. Date: [id number] Attending:  Corliss Marcus CC:         Francisca December, M.D.             Cardiac Catheterization             Soyla Murphy. Renne Crigler, M.D.                        Cardiac Catheterization  PROCEDURES PERFORMED: 1. Left heart catheterization. 2. Coronary angiography. 3. Left ventriculogram. 4. PTCA (cutting balloon), mid LAD.  INDICATIONS:  [name] is a 77 year old man who is status post multiple PTCA and stent implantations in the left anterior descending artery and right coronary artery.  He presented with symptoms of unstable angina pectoris.  He recently underwent a screening exercise treadmill test to evaluate for possible six-month restenosis.  This study was markedly positive, with reproduction of angina pectoris, ischemic electrocardiographic changes and abnormal perfusion (demonstrated by SPECT Cardiolite in the distribution of the LAD).  The right coronary artery distribution appeared to be well perfused.  He is brought now to the catheterization laboratory to evaluate for possible in-stent restenosis or progressive disease, and provide for further therapeutic options.  PROCEDURAL NOTE:  The patient was brought to the cardiac catheterization laboratory in the postabsorptive state.  The right groin was prepped and draped in the usual sterile fashion.  Local anesthesia was obtained with the infiltration of 1% Xylocaine.  A 5-French catheter sheath was introduced percutaneously into the right femoral artery utilizing an anterior approach over a guiding J-wire.  A 5-French 110 cm pigtail catheter was then advanced to the ascending aorta, where the pressure was recorded.  The catheter was then prolapsed across the aortic valve, and the pressure again recorded in the left ventricle -- both prior to and  following the ventriculogram.  A 30-degree RAO cineangiographic left ventriculogram was performed utilizing a power injector.  Omnipaque (45 cc) was injected at 13 cc/sec.  Following the sublingual administration of 0.4 mg nitroglycerin, coronary angiography was performed utilizing a 5-French #4 left Judkins catheter. Cineangiography of the left coronary artery was conducted in multiple LAO and RAO projections.  The 5-French #4 left Judkins catheter was then exchanged for a 5-French #4 right Judkins catheter.  Cineangiography of the right coronary artery was conducted in LAO and RAO projections.  At the completion of the coronary angiography, preparations were made for coronary intervention in the left anterior descending artery.  There was in-stent restenosis in the mid LAD stent.  The 5-French right femoral arterial sheath was exchanged over a long guiding J-wire for a 6-French catheter sheath.  The 6-French catheter sheath was inserted percutaneously into the right femoral vein, utilizing an anterior approach over a guiding J-wire for intravenous access.  The 6-French FL4 guiding catheter was advanced to the ascending aorta, where the left coronary ostium was engaged.  The lesion was crossed with a 0.014 SciMed Luge intracoronary guide wire.  This was followed by a 3.25 x 10 mm cutting balloon.  This device was inflated a total of five times in the stent and at the inlet and outlet, to a maximum pressure of 8 atm for a maximum duration of 2 min.  This did reproduce significant angina  pectoris on each balloon inflation.  Following this, the cutting balloon was removed and cineangiography performed in orthogonal views.  The guide wire was then removed and cineangiography again performed in orthogonal views.  This demonstrates a wide patency through the stent and at the inlets and outlets.  The guiding catheter was then removed and the sheath sutured in place.  The patient was  transported to the recovery area in stable condition, with intact distal pulses.  FLUOROSCOPY TIME:  10.6 min.  TOTAL CONTRAST UTILIZED:  150 cc Omnipaque.  105 cc Visipaque.  MEDICATIONS:  It should be noted the patient received initial bolus of 4000 units of heparin, resulting in an ACT of 229 sec.  He then received another 1000 units of heparin, resulting in a final ACT of 294 sec.  He did receive a bolus and then a constant infusion of ReoPro during the procedure as well.  HEMODYNAMICS: 1. Systemic arterial pressure:  165/69 with mean 106 mmHg.  There was no    systolic gradient across the aortic valve. 2. Left ventricular end-diastolic pressure:  (Pre-ventriculogram) 24 mmHg;    (Post-ventriculogram) 27 mmHg.  ANGIOGRAPHY:  LEFT VENTRICULOGRAM:  Demonstrated normal global systolic function.  There was a calculated ejection fraction of 76%.  There was no mitral regurgitation.  CORONARIES:  There was left and right coronary calcification seen, as well as the easily visible four stents (one in the mid LAD; three in the proximal, mid and distal right coronary artery).   There was a right coronary dominant system present. 1. LEFT MAIN CORONARY ARTERY:  Normal. 2. LEFT ANTERIOR DESCENDING ARTERY (and its branches):  Highly diseased.  The    vessel contained a mid-vessel 80-90% stenosis, which began at the origin of    the mid vessel stent.  It extended through the stent to just beyond the    outlet.  The most severe portion of stenosis was in the inlet of the stent.    In the ongoing LAD there was a moderate-sized third diagonal branch that    had a 50% stenosis at its origin, and the LAD at that point had a 30-40%    stenosis.  The ongoing vessel was large, transapical and diseased.  There    was multiple 20-30% stenoses seen. 3. LEFT CIRCUMFLEX ARTERY (and its branches):  Relatively small in nature.    This is due to the presence of a large first diagonal branch that     subdivided and provides most of the perfusion to the lateroapical and mid     portion of the LV.  The circumflex itself is relatively small and has a    proximal 30% stenosis.  It gives rise to a single small posterolateral    branch on the basilar segment of the LV. 4. RIGHT CORONARY ARTERY:  Moderately diseased.  Demonstrates multiple 20-40%    stenoses.  However, the stented segments are without significant    obstruction.  There is probably a diffuse 30% stenosis in the stent    implanted in the mid portion of the right coronary artery.  The more distal    stent and the proximal stent have no visible restenosis.  There is a 50-60%    stenosis at the origin of a moderate-sized posterior descending artery and    the posterolateral branch and segment are small in size; demonstrate no    significant obstruction.  Finally, there is a 95% stenosis at the origin of    a  moderate-sized right ventricular branch.  Following cutting balloon dilatation in the left anterior descending artery, there was no residual stenosis seen.  FINAL DIAGNOSES: 1. Atherosclerotic cardiovascular disease, two-vessel. 2. Intact left ventricular size and global systolic function. 3. Status post successful PTCA/cutting balloon - mid LAD, for    in-stent restenosis.DD:  12/14/99 TD:  12/16/99 Job: [id number]

## 2010-11-25 NOTE — Op Note (Signed)
NAME:  KAHNE, HELFAND NO.:  [id number]   MEDICAL RECORD NO.:  [id number]          PATIENT TYPE:  AMB   LOCATION:  ENDO                         FACILITY:  [facility]   PHYSICIAN:  Georgiana Spinner, M.D.    DATE OF BIRTH:  August 20, 1928   DATE OF PROCEDURE:  DATE OF DISCHARGE:                                 OPERATIVE REPORT   PROCEDURE PERFORMED:  Upper endoscopy.   ENDOSCOPIST:  Georgiana Spinner, M.D.   INDICATIONS FOR PROCEDURE:  Gastroesophageal reflux disease.   ANESTHESIA:  Demerol 50 mg, Versed 5 mg.   DESCRIPTION OF PROCEDURE:  With the patient mildly sedated in the left  lateral decubitus position, the Olympus videoscopic endoscope was inserted  in the mouth and passed under direct vision through the esophagus which  appeared normal.  There was no evidence of Barrett's esophagus.  We entered  into the stomach.  The fundus, body, antrum, duodenal bulb and second  portion of the duodenum all appeared normal.  From this point, the endoscope  was slowly withdrawn taking circumferential views of the duodenal mucosa  until the endoscope was pulled back into the stomach and placed on  retroflexion to view the stomach from below.  The endoscope was then  straightened and withdrawn taking circumferential views of the remaining  gastric and esophageal mucosa.  The patient's vital signs and pulse oximeter  remained stable.  The patient tolerated the procedure well without apparent  complications.   FINDINGS:  Unremarkable examination.   PLAN:  Proceed to colonoscopy.       GMO/MEDQ  D:  06/16/2004  T:  06/16/2004  Job:  [id number]   cc:   Francisca December, M.D.  [address]  [address]  [place]  [place] [address]  Fax: [phone]   Annabell Howells, MD  Fax: [phone]

## 2010-11-25 NOTE — Op Note (Signed)
NAME:  [name], [name] NO.:  [id number]   MEDICAL RECORD NO.:  [id number]          PATIENT TYPE:  AMB   LOCATION:  ENDO                         FACILITY:  [facility]   PHYSICIAN:  Georgiana Spinner, M.D.    DATE OF BIRTH:  03-26-1929   DATE OF PROCEDURE:  06/15/2004  DATE OF DISCHARGE:                                 OPERATIVE REPORT   PROCEDURE:  Colonoscopy with polypectomy and biopsy.   INDICATIONS:  Colon polyp.   ANESTHESIA:  Demerol 25 mg, Versed 2.5 mg.   PROCEDURE:  With the patient mildly sedated in the left lateral decubitus  position, a rectal examination was performed, which was unremarkable.  The  prostate felt normal.  Subsequently the Olympus videoscopic colonoscope was  inserted in the rectum and passed under direct vision to the cecum,  identified by ileocecal valve and appendiceal orifice.  From this point the  colonoscope was slowly withdrawn, taking circumferential views of the  colonic mucosa, stopping just above the ileocecal valve, where a polyp was  seen and removed using snare cautery technique, setting of 20/200 blended  current.  There was some residual polypoid tissue remaining, and this was  removed using the hot biopsy forceps technique with the same setting.  Just  superior to this polyp was a small ulcer.  This was photographed and it was  biopsied with cold biopsy technique, placed in the same jar as the polyp.  The endoscope was then withdrawn, taking circumferential views of the  remaining colonic mucosa, stopping in the descending colon, where a small  polyp was seen, photographed, and removed using hot biopsy forceps  technique, setting of 20/200 blended current.  In the rectum the endoscope  was placed in retroflexion to view the anal canal from above.  This appeared  normal.  The endoscope was straightened and withdrawn.  The patient's vital  signs and pulse oximetry remained stable.  The patient tolerated the  procedure well  without apparent complications.   FINDINGS:  Polyp of the cecum just above the ileocecal valve, removed, and  an ulcer in this area as well, biopsied.   Will await biopsy report as well as biopsy report of descending colon polyp.  The patient will call me for results and follow up as an outpatient.       GMO/MEDQ  D:  06/16/2004  T:  06/16/2004  Job:  [id number]   cc:   Dr. Tresa Garter, M.D.  [address]  [address]  [place]  [place] [address]  Fax: [phone]

## 2011-04-19 LAB — URINALYSIS, ROUTINE W REFLEX MICROSCOPIC
Bilirubin Urine: NEGATIVE
Glucose, UA: NEGATIVE
Hgb urine dipstick: NEGATIVE
Ketones, ur: NEGATIVE
Nitrite: NEGATIVE
Protein, ur: NEGATIVE
Specific Gravity, Urine: 1.007
Urobilinogen, UA: 0.2
pH: 6

## 2011-04-19 LAB — BASIC METABOLIC PANEL
Calcium: 9.3
Creatinine, Ser: 1.46
GFR calc Af Amer: 57 — ABNORMAL LOW
GFR calc non Af Amer: 47 — ABNORMAL LOW
Potassium: 4.5

## 2011-04-19 LAB — CBC
HCT: 37 — ABNORMAL LOW
Hemoglobin: 12.6 — ABNORMAL LOW
MCHC: 34
MCV: 89
Platelets: 218
RBC: 4.15 — ABNORMAL LOW
RDW: 12.5
WBC: 7.4

## 2011-04-19 LAB — BASIC METABOLIC PANEL WITH GFR
BUN: 14
CO2: 28
Chloride: 99
Glucose, Bld: 100 — ABNORMAL HIGH
Sodium: 134 — ABNORMAL LOW

## 2011-04-20 LAB — CBC
HCT: 37 — ABNORMAL LOW
HCT: 41.4
Hemoglobin: 12.4 — ABNORMAL LOW
Hemoglobin: 14.2
MCHC: 34.1
MCHC: 34.2
MCHC: 34.3
MCHC: 34.5
MCV: 87.6
MCV: 88.3
Platelets: 140 — ABNORMAL LOW
Platelets: 150
Platelets: 151
Platelets: 163
RBC: 4.19 — ABNORMAL LOW
RBC: 4.53
RDW: 12.2
RDW: 12.2
WBC: 7.3

## 2011-04-20 LAB — COMPREHENSIVE METABOLIC PANEL
ALT: 20
ALT: 22
AST: 17
AST: 21
AST: 22
Albumin: 3.1 — ABNORMAL LOW
Albumin: 3.4 — ABNORMAL LOW
Albumin: 3.5
Albumin: 4
Alkaline Phosphatase: 54
Alkaline Phosphatase: 68
BUN: 10
CO2: 27
Calcium: 8.8
Calcium: 9
Chloride: 101
Chloride: 99
Creatinine, Ser: 1.15
Creatinine, Ser: 1.2
GFR calc Af Amer: 49 — ABNORMAL LOW
GFR calc Af Amer: >60
GFR calc Af Amer: >60
GFR calc Af Amer: >60
GFR calc non Af Amer: 53 — ABNORMAL LOW
Glucose, Bld: 100 — ABNORMAL HIGH
Glucose, Bld: 137 — ABNORMAL HIGH
Potassium: 3.1 — ABNORMAL LOW
Potassium: 3.9
Sodium: 134 — ABNORMAL LOW
Total Bilirubin: 0.9
Total Bilirubin: 1.1
Total Protein: 5.7 — ABNORMAL LOW

## 2011-04-20 LAB — AMYLASE
Amylase: 105
Amylase: 556 — ABNORMAL HIGH

## 2011-04-20 LAB — DIFFERENTIAL
Basophils Absolute: 0
Basophils Absolute: 0
Basophils Relative: 0
Eosinophils Absolute: 0.1
Eosinophils Absolute: 0.1
Eosinophils Relative: 1
Eosinophils Relative: 1
Lymphocytes Relative: 19
Lymphs Abs: 1.3
Monocytes Absolute: 0.8 — ABNORMAL HIGH
Monocytes Relative: 11
Neutro Abs: 4
Neutrophils Relative %: 65
Neutrophils Relative %: 72

## 2011-04-20 LAB — POCT CARDIAC MARKERS
CKMB, poc: <1 — ABNORMAL LOW
Myoglobin, poc: 103
Myoglobin, poc: 97.6

## 2011-04-20 LAB — LIPID PANEL
LDL Cholesterol: 34
Total CHOL/HDL Ratio: 2.8
Triglycerides: 87
VLDL: 17

## 2011-04-20 LAB — LIPASE, BLOOD
Lipase: 24
Lipase: 39

## 2011-04-20 LAB — CULTURE, BLOOD (ROUTINE X 2)

## 2011-04-20 LAB — THYROID ANTIBODIES: Thyroglobulin Ab: <30 U/mL

## 2011-04-20 LAB — PREALBUMIN: Prealbumin: 19.6

## 2011-07-17 DIAGNOSIS — H6120 Impacted cerumen, unspecified ear: Secondary | ICD-10-CM | POA: Diagnosis not present

## 2011-07-17 DIAGNOSIS — H60399 Other infective otitis externa, unspecified ear: Secondary | ICD-10-CM | POA: Diagnosis not present

## 2011-09-13 DIAGNOSIS — E7800 Pure hypercholesterolemia, unspecified: Secondary | ICD-10-CM | POA: Diagnosis not present

## 2011-09-13 DIAGNOSIS — I2510 Atherosclerotic heart disease of native coronary artery without angina pectoris: Secondary | ICD-10-CM | POA: Diagnosis not present

## 2011-09-13 DIAGNOSIS — I200 Unstable angina: Secondary | ICD-10-CM | POA: Diagnosis not present

## 2011-09-13 DIAGNOSIS — I10 Essential (primary) hypertension: Secondary | ICD-10-CM | POA: Diagnosis not present

## 2012-02-19 DIAGNOSIS — R252 Cramp and spasm: Secondary | ICD-10-CM | POA: Diagnosis not present

## 2012-02-19 DIAGNOSIS — L259 Unspecified contact dermatitis, unspecified cause: Secondary | ICD-10-CM | POA: Diagnosis not present

## 2012-03-18 DIAGNOSIS — B356 Tinea cruris: Secondary | ICD-10-CM | POA: Diagnosis not present

## 2012-03-18 DIAGNOSIS — I10 Essential (primary) hypertension: Secondary | ICD-10-CM | POA: Diagnosis not present

## 2012-03-18 DIAGNOSIS — E785 Hyperlipidemia, unspecified: Secondary | ICD-10-CM | POA: Diagnosis not present

## 2012-04-18 DIAGNOSIS — B356 Tinea cruris: Secondary | ICD-10-CM | POA: Diagnosis not present

## 2012-04-18 DIAGNOSIS — IMO0002 Reserved for concepts with insufficient information to code with codable children: Secondary | ICD-10-CM | POA: Diagnosis not present

## 2012-04-18 DIAGNOSIS — Z23 Encounter for immunization: Secondary | ICD-10-CM | POA: Diagnosis not present

## 2012-05-09 DIAGNOSIS — B356 Tinea cruris: Secondary | ICD-10-CM | POA: Diagnosis not present

## 2012-05-09 DIAGNOSIS — E663 Overweight: Secondary | ICD-10-CM | POA: Diagnosis not present

## 2012-05-09 DIAGNOSIS — Z23 Encounter for immunization: Secondary | ICD-10-CM | POA: Diagnosis not present

## 2012-05-09 DIAGNOSIS — IMO0002 Reserved for concepts with insufficient information to code with codable children: Secondary | ICD-10-CM | POA: Diagnosis not present

## 2012-05-09 DIAGNOSIS — Z0000 Encounter for general adult medical examination without abnormal findings: Secondary | ICD-10-CM | POA: Diagnosis not present

## 2012-06-20 ENCOUNTER — Encounter (HOSPITAL_COMMUNITY): Payer: Self-pay

## 2012-06-20 ENCOUNTER — Emergency Department (HOSPITAL_COMMUNITY): Payer: Medicare Other

## 2012-06-20 ENCOUNTER — Emergency Department (HOSPITAL_COMMUNITY)
Admission: EM | Admit: 2012-06-20 | Discharge: 2012-06-20 | Disposition: A | Discharge status: Home / Self Care | Payer: Medicare Other | Attending: Emergency Medicine | Admitting: Emergency Medicine

## 2012-06-20 DIAGNOSIS — E785 Hyperlipidemia, unspecified: Secondary | ICD-10-CM | POA: Diagnosis not present

## 2012-06-20 DIAGNOSIS — S61409A Unspecified open wound of unspecified hand, initial encounter: Secondary | ICD-10-CM | POA: Diagnosis not present

## 2012-06-20 DIAGNOSIS — S61209A Unspecified open wound of unspecified finger without damage to nail, initial encounter: Secondary | ICD-10-CM | POA: Insufficient documentation

## 2012-06-20 DIAGNOSIS — S62639A Displaced fracture of distal phalanx of unspecified finger, initial encounter for closed fracture: Secondary | ICD-10-CM

## 2012-06-20 DIAGNOSIS — I10 Essential (primary) hypertension: Secondary | ICD-10-CM | POA: Diagnosis not present

## 2012-06-20 DIAGNOSIS — S61219A Laceration without foreign body of unspecified finger without damage to nail, initial encounter: Secondary | ICD-10-CM

## 2012-06-20 DIAGNOSIS — W230XXA Caught, crushed, jammed, or pinched between moving objects, initial encounter: Secondary | ICD-10-CM | POA: Insufficient documentation

## 2012-06-20 DIAGNOSIS — IMO0002 Reserved for concepts with insufficient information to code with codable children: Secondary | ICD-10-CM | POA: Diagnosis not present

## 2012-06-20 DIAGNOSIS — Y9289 Other specified places as the place of occurrence of the external cause: Secondary | ICD-10-CM | POA: Insufficient documentation

## 2012-06-20 DIAGNOSIS — S62233A Other displaced fracture of base of first metacarpal bone, unspecified hand, initial encounter for closed fracture: Secondary | ICD-10-CM | POA: Diagnosis not present

## 2012-06-20 DIAGNOSIS — Y9389 Activity, other specified: Secondary | ICD-10-CM | POA: Insufficient documentation

## 2012-06-20 HISTORY — DX: Hyperlipidemia, unspecified: E78.5

## 2012-06-20 HISTORY — DX: Essential (primary) hypertension: I10

## 2012-06-20 MED ORDER — BUPIVACAINE HCL (PF) 0.5 % IJ SOLN
10.0000 mL | Freq: Once | INTRAMUSCULAR | Status: AC
  Administered 2012-06-20: 10 mL
  Filled 2012-06-20: qty 10

## 2012-06-20 MED ORDER — BUPIVACAINE HCL 0.25 % IJ SOLN: 50.0000 mL | Freq: Once | INTRAMUSCULAR | Status: DC

## 2012-06-20 MED ORDER — TETANUS-DIPHTH-ACELL PERTUSSIS 5-2.5-18.5 LF-MCG/0.5 IM SUSP
0.5000 mL | Freq: Once | INTRAMUSCULAR | Status: AC
  Administered 2012-06-20: 0.5 mL via INTRAMUSCULAR
  Filled 2012-06-20: qty 0.5

## 2012-06-20 MED ORDER — HYDROCODONE-ACETAMINOPHEN 5-325 MG PO TABS: 1.0000 | ORAL_TABLET | Freq: Four times a day (QID) | ORAL | Status: DC | PRN

## 2012-06-20 MED ORDER — HYDROCODONE-ACETAMINOPHEN 5-325 MG PO TABS
1.0000 | ORAL_TABLET | Freq: Once | ORAL | Status: AC
  Administered 2012-06-20: 1 via ORAL
  Filled 2012-06-20: qty 1

## 2012-06-20 NOTE — ED Notes (Signed)
Paged Dr. Ave Filter to [phone]

## 2012-06-20 NOTE — Progress Notes (Signed)
Orthopedic Tech Progress Note Patient Details:  [name] [birth year] [id number]  Ortho Devices Type of Ortho Device: Finger splint Ortho Device/Splint Location: (R) UE Ortho Device/Splint Interventions: Application   Jennye Moccasin 06/20/2012, 8:28 PM

## 2012-06-20 NOTE — ED Provider Notes (Signed)
     LACERATION REPAIR Performed by: Jimmye Norman Authorized by: Jimmye Norman Consent: Verbal consent obtained. Risks and benefits: risks, benefits and alternatives were discussed Consent given by: patient Patient identity confirmed: provided demographic data Prepped and Draped in normal sterile fashion Wound explored  Laceration Location: left little finger, volar surface, DIP  Laceration Length: 1.0 cm  No Foreign Bodies seen or palpated  Anesthesia: digital block  Local anesthetic: lidocaine 2% / Marcaine 0.5%  Anesthetic total: 2 ml  Irrigation method: syringe Amount of cleaning: standard  Skin closure:  5.0prolene  Number of sutures: 5  Technique: simple interrupted  Patient tolerance: Patient tolerated the procedure well with no immediate complications.  LACERATION REPAIR Performed by: Jimmye Norman Authorized by: Jimmye Norman Consent: Verbal consent obtained. Risks and benefits: risks, benefits and alternatives were discussed Consent given by: patient Patient identity confirmed: provided demographic data Prepped and Draped in normal sterile fashion Wound explored  Laceration Location: right ring finger, volar surface, distal phalange  Laceration Length: 0.3 cm  No Foreign Bodies seen or palpated  Anesthesia: digital block  Local anesthetic: lidocaine 2%/Marcaine 0.5%  Anesthetic total: 2 ml  Irrigation method: syringe  Amount of cleaning: extensive  Skin closure: 5.0 prolene  Number of sutures: 2  Technique: simple interrupted  Patient tolerance: Patient tolerated the procedure well with no immediate complications.  Patient seen at request of Dr. Ethelda Chick to complete laceration repair of fingers as noted above.  Also discussed with Dr. Merlyn Lot , who will follow closely in the office.  Patient to call tomorrow to schedule appointment.  Jimmye Norman, NP 06/20/12 2128

## 2012-06-20 NOTE — ED Notes (Signed)
Pt was pulling garage door down and got both hands caught under the door at different times. Has laceration to left 5th digit tip and right 4th digit tip.

## 2012-06-20 NOTE — ED Notes (Signed)
Fingers shut in garage door. Lacerations to right 3rd and 4th fingers and left 5th finger. Contusions to nail beds both hands. Pt has numbness in right 3rd and 4th fingers and left 5th finger. Pt unsure of tetanus status.

## 2012-06-20 NOTE — ED Provider Notes (Signed)
History   This chart was scribed for [name], MD by Leone Payor, ED Scribe. This patient was seen in room TR07C/TR07C and the patient's care was started at 3:30PM.   CSN: [id number]  Arrival date & time 06/20/12  1442   None     Chief Complaint  Patient presents with  . Hand Injury  . Laceration     The history is provided by the patient. No language interpreter was used.    [name] is a 78 y.o. male who presents to the Emergency Department complaining of a hand laceration to left 5th digit tip and right 4th digit tip starting 1 hour ago. Pt was pulling garage door down and got both hands caught under the door at different times. Pt states the pain was worse when it first happened but has since improved without treatment no other complaint   Pt has h/o HTN, hyperlipidemia, coronary stent placement (4 stents), cholecystectomy, thyroid surgery.  Pt denies smoking and alcohol use. Past Medical History  Diagnosis Date  . Hypertension   . Hyperlipidemia     Past Surgical History  Procedure Date  . Coronary stent placement   . Cholecystectomy   . Thyroid surgery     growth removed    No family history on file.  History  Substance Use Topics  . Smoking status: Never Smoker   . Smokeless tobacco: Not on file  . Alcohol Use: No      Review of Systems  Constitutional: Negative.   HENT: Negative.   Gastrointestinal: Negative.   Musculoskeletal: Negative.   Skin: Positive for wound (laceration to left 5th digit and right 4th digit).  Neurological: Positive for numbness.       Complains of slight numbness in middle and index finger right hand and fingertips since the event today  Hematological: Negative.   Psychiatric/Behavioral: Negative.   All other systems reviewed and are negative.    Allergies  Review of patient's allergies indicates not on file.  Home Medications  No current outpatient prescriptions on file.  BP 148/46  Pulse 65  Temp  97.8 F (36.6 C) (Oral)  Resp 20  SpO2 98%  Physical Exam  Nursing note and vitals reviewed. Constitutional: He appears well-developed and well-nourished. He appears distressed.  HENT:  Head: Normocephalic and atraumatic.  Eyes: Conjunctivae normal are normal. Pupils are equal, round, and reactive to light.  Neck: Neck supple. No tracheal deviation present. No thyromegaly present.  Cardiovascular: Normal rate.   Pulmonary/Chest: Effort normal.  Abdominal: He exhibits no distension.  Musculoskeletal: Normal range of motion. He exhibits no edema and no tenderness.       Right hand has subungual hematoma to middle and ring finger. Ecchymotic at volar pad, distal phalanx to middle ring figner. Has two 2mm lacerations to the ring finger.   Left hand has subungual hematoma to 2,3,4,5 fingers. The 5th finger has 1cm laceration to distal phalanx to the palmer aspect.  All digits of both hands neurovascularly intact, full range of motion  Neurological: He is alert. Coordination normal.  Skin: Skin is warm and dry. No rash noted.  Psychiatric: He has a normal mood and affect.    ED Course  Procedures (including critical care time)  DIAGNOSTIC STUDIES: Oxygen Saturation is 98% on room air, normal by my interpretation.    COORDINATION OF CARE:     Labs Reviewed - No data to display No results found.   No diagnosis found.  MDM  Sent to CDU for wound repair by Mr. Katrinka Blazing. I feel laceration of fifth finger left hand requires repair. The other lacerations are sufficiently small but they require local wound care only. Subungual hematomas did not require trephination as they are small. Patient should get his wounds rechecked in 2 days Diagnosis crush injury to bilateral hands       I personally performed the services described in this documentation, which was scribed in my presence. The recorded information has been reviewed and is accurate.     [name], MD 06/20/12  [id number]

## 2012-06-21 NOTE — ED Provider Notes (Signed)
Medical screening examination/treatment/procedure(s) were conducted as a shared visit with non-physician practitioner(s) and myself.  I personally evaluated the patient during the encounter.    [name], MD 06/21/12 0700

## 2012-06-24 DIAGNOSIS — S62639A Displaced fracture of distal phalanx of unspecified finger, initial encounter for closed fracture: Secondary | ICD-10-CM | POA: Diagnosis not present

## 2012-06-24 DIAGNOSIS — S6990XA Unspecified injury of unspecified wrist, hand and finger(s), initial encounter: Secondary | ICD-10-CM | POA: Diagnosis not present

## 2012-06-24 DIAGNOSIS — S6980XA Other specified injuries of unspecified wrist, hand and finger(s), initial encounter: Secondary | ICD-10-CM | POA: Diagnosis not present

## 2012-06-24 DIAGNOSIS — S6710XA Crushing injury of unspecified finger(s), initial encounter: Secondary | ICD-10-CM | POA: Diagnosis not present

## 2012-07-01 DIAGNOSIS — S6990XA Unspecified injury of unspecified wrist, hand and finger(s), initial encounter: Secondary | ICD-10-CM | POA: Diagnosis not present

## 2012-07-01 DIAGNOSIS — S62639A Displaced fracture of distal phalanx of unspecified finger, initial encounter for closed fracture: Secondary | ICD-10-CM | POA: Diagnosis not present

## 2012-07-01 DIAGNOSIS — S6710XA Crushing injury of unspecified finger(s), initial encounter: Secondary | ICD-10-CM | POA: Diagnosis not present

## 2012-07-08 DIAGNOSIS — S6980XA Other specified injuries of unspecified wrist, hand and finger(s), initial encounter: Secondary | ICD-10-CM | POA: Diagnosis not present

## 2012-07-08 DIAGNOSIS — S6990XA Unspecified injury of unspecified wrist, hand and finger(s), initial encounter: Secondary | ICD-10-CM | POA: Diagnosis not present

## 2012-07-08 DIAGNOSIS — S62639A Displaced fracture of distal phalanx of unspecified finger, initial encounter for closed fracture: Secondary | ICD-10-CM | POA: Diagnosis not present

## 2012-07-08 DIAGNOSIS — S6710XA Crushing injury of unspecified finger(s), initial encounter: Secondary | ICD-10-CM | POA: Diagnosis not present

## 2012-09-11 DIAGNOSIS — E7800 Pure hypercholesterolemia, unspecified: Secondary | ICD-10-CM | POA: Diagnosis not present

## 2012-09-11 DIAGNOSIS — I2510 Atherosclerotic heart disease of native coronary artery without angina pectoris: Secondary | ICD-10-CM | POA: Diagnosis not present

## 2012-09-16 DIAGNOSIS — E7800 Pure hypercholesterolemia, unspecified: Secondary | ICD-10-CM | POA: Diagnosis not present

## 2012-09-16 DIAGNOSIS — I2510 Atherosclerotic heart disease of native coronary artery without angina pectoris: Secondary | ICD-10-CM | POA: Diagnosis not present

## 2012-12-13 ENCOUNTER — Ambulatory Visit
Admission: RE | Admit: 2012-12-13 | Discharge: 2012-12-13 | Disposition: A | Discharge status: Home / Self Care | Payer: Medicare Other | Source: Ambulatory Visit | Attending: Family Medicine | Admitting: Family Medicine

## 2012-12-13 ENCOUNTER — Other Ambulatory Visit: Payer: Self-pay | Admitting: Family Medicine

## 2012-12-13 DIAGNOSIS — R141 Gas pain: Secondary | ICD-10-CM | POA: Diagnosis not present

## 2012-12-13 DIAGNOSIS — I10 Essential (primary) hypertension: Secondary | ICD-10-CM

## 2012-12-13 DIAGNOSIS — R197 Diarrhea, unspecified: Secondary | ICD-10-CM | POA: Diagnosis not present

## 2012-12-13 DIAGNOSIS — K219 Gastro-esophageal reflux disease without esophagitis: Secondary | ICD-10-CM | POA: Diagnosis not present

## 2012-12-13 DIAGNOSIS — K5900 Constipation, unspecified: Secondary | ICD-10-CM

## 2012-12-13 DIAGNOSIS — I2510 Atherosclerotic heart disease of native coronary artery without angina pectoris: Secondary | ICD-10-CM | POA: Diagnosis not present

## 2013-03-14 DIAGNOSIS — H6120 Impacted cerumen, unspecified ear: Secondary | ICD-10-CM | POA: Diagnosis not present

## 2013-03-24 DIAGNOSIS — H6120 Impacted cerumen, unspecified ear: Secondary | ICD-10-CM | POA: Diagnosis not present

## 2013-03-26 DIAGNOSIS — Z23 Encounter for immunization: Secondary | ICD-10-CM | POA: Diagnosis not present

## 2013-04-15 ENCOUNTER — Emergency Department (HOSPITAL_COMMUNITY): Payer: Medicare Other

## 2013-04-15 ENCOUNTER — Encounter (HOSPITAL_COMMUNITY): Payer: Self-pay | Admitting: Emergency Medicine

## 2013-04-15 ENCOUNTER — Observation Stay (HOSPITAL_COMMUNITY)
Admission: EM | Admit: 2013-04-15 | Discharge: 2013-04-16 | Disposition: A | Discharge status: Home / Self Care | Payer: Medicare Other | Attending: General Surgery | Admitting: General Surgery

## 2013-04-15 DIAGNOSIS — S61409A Unspecified open wound of unspecified hand, initial encounter: Secondary | ICD-10-CM | POA: Diagnosis not present

## 2013-04-15 DIAGNOSIS — S3600XA Unspecified injury of spleen, initial encounter: Secondary | ICD-10-CM | POA: Diagnosis not present

## 2013-04-15 DIAGNOSIS — S36039A Unspecified laceration of spleen, initial encounter: Secondary | ICD-10-CM

## 2013-04-15 DIAGNOSIS — S298XXA Other specified injuries of thorax, initial encounter: Secondary | ICD-10-CM | POA: Diagnosis not present

## 2013-04-15 DIAGNOSIS — S4980XA Other specified injuries of shoulder and upper arm, unspecified arm, initial encounter: Secondary | ICD-10-CM | POA: Diagnosis not present

## 2013-04-15 DIAGNOSIS — R109 Unspecified abdominal pain: Secondary | ICD-10-CM | POA: Diagnosis not present

## 2013-04-15 DIAGNOSIS — R079 Chest pain, unspecified: Secondary | ICD-10-CM | POA: Insufficient documentation

## 2013-04-15 DIAGNOSIS — S36030A Superficial (capsular) laceration of spleen, initial encounter: Secondary | ICD-10-CM | POA: Diagnosis not present

## 2013-04-15 DIAGNOSIS — T1490XA Injury, unspecified, initial encounter: Secondary | ICD-10-CM | POA: Diagnosis not present

## 2013-04-15 DIAGNOSIS — R1012 Left upper quadrant pain: Secondary | ICD-10-CM | POA: Diagnosis not present

## 2013-04-15 DIAGNOSIS — S8000XA Contusion of unspecified knee, initial encounter: Secondary | ICD-10-CM | POA: Insufficient documentation

## 2013-04-15 DIAGNOSIS — IMO0002 Reserved for concepts with insufficient information to code with codable children: Secondary | ICD-10-CM | POA: Diagnosis not present

## 2013-04-15 DIAGNOSIS — Z79899 Other long term (current) drug therapy: Secondary | ICD-10-CM | POA: Insufficient documentation

## 2013-04-15 DIAGNOSIS — M1990 Unspecified osteoarthritis, unspecified site: Secondary | ICD-10-CM | POA: Diagnosis not present

## 2013-04-15 DIAGNOSIS — S3981XA Other specified injuries of abdomen, initial encounter: Secondary | ICD-10-CM | POA: Diagnosis not present

## 2013-04-15 DIAGNOSIS — T148XXA Other injury of unspecified body region, initial encounter: Secondary | ICD-10-CM | POA: Insufficient documentation

## 2013-04-15 DIAGNOSIS — I10 Essential (primary) hypertension: Secondary | ICD-10-CM | POA: Diagnosis not present

## 2013-04-15 DIAGNOSIS — M25569 Pain in unspecified knee: Secondary | ICD-10-CM | POA: Diagnosis not present

## 2013-04-15 DIAGNOSIS — S59909A Unspecified injury of unspecified elbow, initial encounter: Secondary | ICD-10-CM | POA: Diagnosis not present

## 2013-04-15 DIAGNOSIS — D696 Thrombocytopenia, unspecified: Secondary | ICD-10-CM | POA: Diagnosis present

## 2013-04-15 DIAGNOSIS — M546 Pain in thoracic spine: Secondary | ICD-10-CM | POA: Diagnosis not present

## 2013-04-15 DIAGNOSIS — E041 Nontoxic single thyroid nodule: Secondary | ICD-10-CM | POA: Insufficient documentation

## 2013-04-15 DIAGNOSIS — D62 Acute posthemorrhagic anemia: Secondary | ICD-10-CM | POA: Diagnosis not present

## 2013-04-15 DIAGNOSIS — S8990XA Unspecified injury of unspecified lower leg, initial encounter: Secondary | ICD-10-CM | POA: Diagnosis not present

## 2013-04-15 LAB — CBC WITH DIFFERENTIAL/PLATELET
Basophils Relative: 0 % (ref 0–1)
Eosinophils Absolute: 0.3 10*3/uL (ref 0.0–0.7)
Eosinophils Relative: 4 % (ref 0–5)
Hemoglobin: 13 g/dL (ref 13.0–17.0)
Lymphs Abs: 1.6 10*3/uL (ref 0.7–4.0)
MCH: 30.9 pg (ref 26.0–34.0)
MCHC: 34.3 g/dL (ref 30.0–36.0)
MCV: 90 fL (ref 78.0–100.0)
Monocytes Absolute: 0.9 10*3/uL (ref 0.1–1.0)
Neutro Abs: 5 10*3/uL (ref 1.7–7.7)
Neutrophils Relative %: 65 % (ref 43–77)
RBC: 4.21 MIL/uL — ABNORMAL LOW (ref 4.22–5.81)

## 2013-04-15 LAB — BASIC METABOLIC PANEL
BUN: 32 mg/dL — ABNORMAL HIGH (ref 6–23)
CO2: 27 mEq/L (ref 19–32)
Calcium: 9.1 mg/dL (ref 8.4–10.5)
Chloride: 102 mEq/L (ref 96–112)
Creatinine, Ser: 1.19 mg/dL (ref 0.50–1.35)
Glucose, Bld: 87 mg/dL (ref 70–99)

## 2013-04-15 MED ORDER — ACETAMINOPHEN 500 MG PO TABS
1000.0000 mg | ORAL_TABLET | Freq: Once | ORAL | Status: AC
  Administered 2013-04-15: 1000 mg via ORAL
  Filled 2013-04-15: qty 2

## 2013-04-15 MED ORDER — ONDANSETRON HCL 4 MG/2ML IJ SOLN: 4.0000 mg | Freq: Four times a day (QID) | INTRAMUSCULAR | Status: DC | PRN

## 2013-04-15 MED ORDER — HYDROCHLOROTHIAZIDE 25 MG PO TABS
25.0000 mg | ORAL_TABLET | Freq: Every day | ORAL | Status: DC
  Administered 2013-04-16: 25 mg via ORAL
  Filled 2013-04-15: qty 1

## 2013-04-15 MED ORDER — OXYCODONE HCL 5 MG PO TABS
5.0000 mg | ORAL_TABLET | ORAL | Status: DC | PRN
  Administered 2013-04-15: 5 mg via ORAL
  Filled 2013-04-15: qty 1

## 2013-04-15 MED ORDER — HYDROMORPHONE HCL PF 1 MG/ML IJ SOLN: 1.0000 mg | INTRAMUSCULAR | Status: DC | PRN

## 2013-04-15 MED ORDER — ONDANSETRON HCL 4 MG/2ML IJ SOLN
4.0000 mg | Freq: Once | INTRAMUSCULAR | Status: AC
  Administered 2013-04-15: 4 mg via INTRAVENOUS
  Filled 2013-04-15: qty 2

## 2013-04-15 MED ORDER — SODIUM CHLORIDE 0.9 % IV BOLUS (SEPSIS)
1000.0000 mL | Freq: Once | INTRAVENOUS | Status: AC
  Administered 2013-04-15: 1000 mL via INTRAVENOUS

## 2013-04-15 MED ORDER — OLMESARTAN MEDOXOMIL-HCTZ 40-25 MG PO TABS: 1.0000 | ORAL_TABLET | Freq: Every day | ORAL | Status: DC

## 2013-04-15 MED ORDER — MORPHINE SULFATE 4 MG/ML IJ SOLN
4.0000 mg | Freq: Once | INTRAMUSCULAR | Status: AC
  Administered 2013-04-15: 4 mg via INTRAVENOUS
  Filled 2013-04-15: qty 1

## 2013-04-15 MED ORDER — IRBESARTAN 300 MG PO TABS
300.0000 mg | ORAL_TABLET | Freq: Every day | ORAL | Status: DC
  Administered 2013-04-16: 300 mg via ORAL
  Filled 2013-04-15: qty 1

## 2013-04-15 MED ORDER — DEXTROSE-NACL 5-0.9 % IV SOLN
INTRAVENOUS | Status: DC
  Administered 2013-04-15: 23:00:00 via INTRAVENOUS

## 2013-04-15 MED ORDER — IOHEXOL 300 MG/ML  SOLN
100.0000 mL | Freq: Once | INTRAMUSCULAR | Status: AC | PRN
  Administered 2013-04-15: 100 mL via INTRAVENOUS

## 2013-04-15 MED ORDER — PANTOPRAZOLE SODIUM 40 MG PO TBEC
40.0000 mg | DELAYED_RELEASE_TABLET | Freq: Every day | ORAL | Status: DC
  Filled 2013-04-15: qty 1

## 2013-04-15 MED ORDER — ONDANSETRON HCL 4 MG PO TABS: 4.0000 mg | ORAL_TABLET | Freq: Four times a day (QID) | ORAL | Status: DC | PRN

## 2013-04-15 MED ORDER — ATORVASTATIN CALCIUM 10 MG PO TABS
10.0000 mg | ORAL_TABLET | Freq: Every day | ORAL | Status: DC
  Filled 2013-04-15: qty 1

## 2013-04-15 NOTE — ED Notes (Signed)
Trauma MD at bedside.
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
## 2013-04-15 NOTE — ED Notes (Signed)
MD at Bedside.
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
## 2013-04-15 NOTE — ED Provider Notes (Signed)
I saw and evaluated the patient, reviewed the resident's note and I agree with the findings and plan.  Please see my separate note regarding my evaluation of the patient.  Clinical Impression:  Splenic injury, contusion of knee, abrasion   Vida Roller, MD 04/15/13 2348
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
## 2013-04-16 DIAGNOSIS — S36039A Unspecified laceration of spleen, initial encounter: Secondary | ICD-10-CM | POA: Diagnosis not present

## 2013-04-16 LAB — COMPREHENSIVE METABOLIC PANEL
ALT: 213 U/L — ABNORMAL HIGH (ref 0–53)
AST: 178 U/L — ABNORMAL HIGH (ref 0–37)
Albumin: 3.2 g/dL — ABNORMAL LOW (ref 3.5–5.2)
Alkaline Phosphatase: 83 U/L (ref 39–117)
BUN: 31 mg/dL — ABNORMAL HIGH (ref 6–23)
CO2: 26 mEq/L (ref 19–32)
Chloride: 106 mEq/L (ref 96–112)
GFR calc Af Amer: 65 mL/min — ABNORMAL LOW (ref >90)
Potassium: 4.2 mEq/L (ref 3.5–5.1)
Total Bilirubin: 0.4 mg/dL (ref 0.3–1.2)

## 2013-04-16 LAB — CBC
HCT: 31.8 % — ABNORMAL LOW (ref 39.0–52.0)
Hemoglobin: 11.1 g/dL — ABNORMAL LOW (ref 13.0–17.0)
MCH: 30.2 pg (ref 26.0–34.0)
MCHC: 34.6 g/dL (ref 30.0–36.0)
MCHC: 33.5 g/dL (ref 30.0–36.0)
MCV: 89.9 fL (ref 78.0–100.0)
Platelets: 140 10*3/uL — ABNORMAL LOW (ref 150–400)
Platelets: 152 10*3/uL (ref 150–400)
RBC: 3.53 MIL/uL — ABNORMAL LOW (ref 4.22–5.81)
RDW: 12.4 % (ref 11.5–15.5)
RDW: 12.4 % (ref 11.5–15.5)
WBC: 9.3 10*3/uL (ref 4.0–10.5)

## 2013-04-16 MED ORDER — CYCLOBENZAPRINE HCL 10 MG PO TABS: 5.0000 mg | ORAL_TABLET | Freq: Three times a day (TID) | ORAL | Status: DC | PRN

## 2013-04-16 MED ORDER — TRAMADOL HCL 50 MG PO TABS: 50.0000 mg | ORAL_TABLET | Freq: Four times a day (QID) | ORAL | Status: DC | PRN

## 2013-04-16 MED ORDER — CYCLOBENZAPRINE HCL 5 MG PO TABS
5.0000 mg | ORAL_TABLET | Freq: Once | ORAL | Status: AC
Start: 2013-04-16 — End: 2013-04-16
  Administered 2013-04-16: 5 mg via ORAL
  Filled 2013-04-16: qty 1

## 2013-04-16 MED ORDER — CYCLOBENZAPRINE HCL 5 MG PO TABS: 5.0000 mg | ORAL_TABLET | Freq: Three times a day (TID) | ORAL | Status: DC | PRN

## 2013-04-16 MED ORDER — ACETAMINOPHEN 325 MG PO TABS: 650.0000 mg | ORAL_TABLET | Freq: Four times a day (QID) | ORAL | Status: DC | PRN

## 2013-04-16 MED ORDER — ACETAMINOPHEN 325 MG PO TABS
650.0000 mg | ORAL_TABLET | Freq: Four times a day (QID) | ORAL | Status: DC | PRN
  Administered 2013-04-16: 650 mg via ORAL
  Filled 2013-04-16: qty 2
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
## 2013-04-16 NOTE — Progress Notes (Signed)
UR completed.  Briana Farner, RN BSN M[facility] CCM Trauma/Neuro ICU Case Manager [phone]  
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
## 2013-04-16 NOTE — Progress Notes (Signed)
Patient seems to be doing okay.  Complaining of back spasms.  Will mobilized and hopefully allow to go home later today.  This patient has been seen and I agree with the findings and treatment plan.  Marta Lamas. Gae Bon, MD, FACS [phone] (pager) [phone] (direct pager) Trauma Surgeon

## 2013-04-16 NOTE — Progress Notes (Signed)
Pt d/c home per MD order, pt VSS, pt d/c instructions given, prescriptions given, all questions answered, family at [facility], pt belongs with pt
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
## 2013-04-17 NOTE — Progress Notes (Signed)
Late entry for missed G-code.  Based on review of evaluation by Lewis Shock, PT.  14-May-2013 1410  PT G-Codes **NOT FOR INPATIENT CLASS**  Functional Assessment Tool Used Clinical judgment based on chart review  Functional Limitation Mobility: Walking and moving around  Mobility: Walking and Moving Around Current Status (Z6109) CI  Mobility: Walking and Moving Around Goal Status (U0454) CI  Mobility: Walking and Moving Around Discharge Status [phone]) CI  [place]ona [place], [place]  [phone] 04/17/2013

## 2013-04-18 ENCOUNTER — Telehealth (HOSPITAL_COMMUNITY): Payer: Self-pay | Admitting: Emergency Medicine

## 2013-04-18 NOTE — Telephone Encounter (Signed)
Patient called to say the abrasion on his little finger looks more like a laceration now. I advised him to wash it daily with soap and water and apply antibiotic ointment twice daily and cover with a dry dressing. He is to call if it doesn't heal or begins to look infected.

## 2013-04-25 ENCOUNTER — Emergency Department (HOSPITAL_COMMUNITY)
Admission: EM | Admit: 2013-04-25 | Discharge: 2013-04-26 | Disposition: A | Discharge status: Home / Self Care | Payer: Medicare Other | Attending: Emergency Medicine | Admitting: Emergency Medicine

## 2013-04-25 ENCOUNTER — Encounter (HOSPITAL_COMMUNITY): Payer: Self-pay | Admitting: Emergency Medicine

## 2013-04-25 DIAGNOSIS — K5900 Constipation, unspecified: Secondary | ICD-10-CM | POA: Insufficient documentation

## 2013-04-25 DIAGNOSIS — R142 Eructation: Secondary | ICD-10-CM | POA: Insufficient documentation

## 2013-04-25 DIAGNOSIS — Z87828 Personal history of other (healed) physical injury and trauma: Secondary | ICD-10-CM | POA: Insufficient documentation

## 2013-04-25 DIAGNOSIS — R630 Anorexia: Secondary | ICD-10-CM | POA: Insufficient documentation

## 2013-04-25 DIAGNOSIS — M79604 Pain in right leg: Secondary | ICD-10-CM

## 2013-04-25 DIAGNOSIS — R1084 Generalized abdominal pain: Secondary | ICD-10-CM | POA: Diagnosis not present

## 2013-04-25 DIAGNOSIS — R140 Abdominal distension (gaseous): Secondary | ICD-10-CM

## 2013-04-25 DIAGNOSIS — R141 Gas pain: Secondary | ICD-10-CM | POA: Diagnosis not present

## 2013-04-25 DIAGNOSIS — R1013 Epigastric pain: Secondary | ICD-10-CM | POA: Diagnosis not present

## 2013-04-25 DIAGNOSIS — G8911 Acute pain due to trauma: Secondary | ICD-10-CM | POA: Diagnosis not present

## 2013-04-25 DIAGNOSIS — M25569 Pain in unspecified knee: Secondary | ICD-10-CM | POA: Diagnosis not present

## 2013-04-25 DIAGNOSIS — E785 Hyperlipidemia, unspecified: Secondary | ICD-10-CM | POA: Insufficient documentation

## 2013-04-25 DIAGNOSIS — Z7982 Long term (current) use of aspirin: Secondary | ICD-10-CM | POA: Insufficient documentation

## 2013-04-25 DIAGNOSIS — Z79899 Other long term (current) drug therapy: Secondary | ICD-10-CM | POA: Diagnosis not present

## 2013-04-25 DIAGNOSIS — M79609 Pain in unspecified limb: Secondary | ICD-10-CM | POA: Diagnosis not present

## 2013-04-25 DIAGNOSIS — I10 Essential (primary) hypertension: Secondary | ICD-10-CM | POA: Diagnosis not present

## 2013-04-25 DIAGNOSIS — M7989 Other specified soft tissue disorders: Secondary | ICD-10-CM | POA: Diagnosis not present

## 2013-04-25 DIAGNOSIS — S8990XA Unspecified injury of unspecified lower leg, initial encounter: Secondary | ICD-10-CM | POA: Diagnosis not present

## 2013-04-25 HISTORY — DX: Unspecified injury of spleen, initial encounter: S36.00XA

## 2013-04-25 LAB — CBC WITH DIFFERENTIAL/PLATELET
Basophils Absolute: 0 10*3/uL (ref 0.0–0.1)
Basophils Relative: 0 % (ref 0–1)
Eosinophils Absolute: 0.3 10*3/uL (ref 0.0–0.7)
Eosinophils Relative: 3 % (ref 0–5)
HCT: 33.9 % — ABNORMAL LOW (ref 39.0–52.0)
Hemoglobin: 11.8 g/dL — ABNORMAL LOW (ref 13.0–17.0)
MCH: 31.2 pg (ref 26.0–34.0)
MCHC: 34.8 g/dL (ref 30.0–36.0)
MCV: 89.7 fL (ref 78.0–100.0)
Monocytes Absolute: 0.7 10*3/uL (ref 0.1–1.0)
Neutro Abs: 5.7 10*3/uL (ref 1.7–7.7)
Platelets: 204 10*3/uL (ref 150–400)
RDW: 12.6 % (ref 11.5–15.5)
WBC: 8.3 10*3/uL (ref 4.0–10.5)

## 2013-04-25 LAB — POCT I-STAT TROPONIN I: Troponin i, poc: 0.01 ng/mL (ref 0.00–0.08)

## 2013-04-25 LAB — COMPREHENSIVE METABOLIC PANEL
ALT: 25 U/L (ref 0–53)
Albumin: 3.8 g/dL (ref 3.5–5.2)
Alkaline Phosphatase: 93 U/L (ref 39–117)
BUN: 24 mg/dL — ABNORMAL HIGH (ref 6–23)
Chloride: 101 mEq/L (ref 96–112)
Creatinine, Ser: 1.26 mg/dL (ref 0.50–1.35)
Potassium: 3.7 mEq/L (ref 3.5–5.1)
Sodium: 141 mEq/L (ref 135–145)
Total Bilirubin: 0.7 mg/dL (ref 0.3–1.2)
Total Protein: 7.2 g/dL (ref 6.0–8.3)

## 2013-04-25 LAB — LIPASE, BLOOD: Lipase: 44 U/L (ref 11–59)
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
## 2013-04-25 NOTE — ED Notes (Signed)
Pharmacy at bedside
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
## 2013-04-26 ENCOUNTER — Ambulatory Visit (HOSPITAL_COMMUNITY)
Admission: RE | Admit: 2013-04-26 | Discharge: 2013-04-26 | Disposition: A | Discharge status: Home / Self Care | Payer: Medicare Other | Source: Ambulatory Visit | Attending: Emergency Medicine | Admitting: Emergency Medicine

## 2013-04-26 ENCOUNTER — Emergency Department (HOSPITAL_COMMUNITY): Payer: Medicare Other

## 2013-04-26 DIAGNOSIS — K5900 Constipation, unspecified: Secondary | ICD-10-CM | POA: Diagnosis not present

## 2013-04-26 DIAGNOSIS — M79609 Pain in unspecified limb: Secondary | ICD-10-CM | POA: Diagnosis not present

## 2013-04-26 DIAGNOSIS — M7989 Other specified soft tissue disorders: Secondary | ICD-10-CM | POA: Diagnosis not present

## 2013-04-26 DIAGNOSIS — S8990XA Unspecified injury of unspecified lower leg, initial encounter: Secondary | ICD-10-CM | POA: Diagnosis not present

## 2013-04-26 DIAGNOSIS — M25569 Pain in unspecified knee: Secondary | ICD-10-CM | POA: Diagnosis not present

## 2013-04-26 MED ORDER — ENOXAPARIN SODIUM 100 MG/ML ~~LOC~~ SOLN
1.0000 mg/kg | Freq: Once | SUBCUTANEOUS | Status: AC
  Administered 2013-04-26: 90 mg via SUBCUTANEOUS
  Filled 2013-04-26: qty 1

## 2013-04-26 MED ORDER — PEG 3350/ELECTROLYTES 240 G PO SOLR: 240.0000 g | Freq: Once | ORAL | Status: DC

## 2013-04-26 NOTE — ED Provider Notes (Signed)
I saw and evaluated the patient, reviewed the resident's note and I agree with the findings and plan.   Dione Booze, MD 04/26/13 [phone]

## 2013-04-26 NOTE — Progress Notes (Signed)
VASCULAR LAB PRELIMINARY  PRELIMINARY  PRELIMINARY  PRELIMINARY  Right lower extremity venous Doppler completed.    Preliminary report:  There is no DVT or SVT noted in the right lower extremity.  Teigan Manner, RVT 04/26/2013, 9:43 AM
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
## 2013-05-22 ENCOUNTER — Encounter: Payer: Self-pay | Admitting: Cardiology

## 2013-05-26 ENCOUNTER — Telehealth: Payer: Self-pay | Admitting: *Deleted

## 2013-05-26 NOTE — Telephone Encounter (Signed)
Pharmacy called regarding patients Crestor. Please call back at [phone] and the ref # is [id number].

## 2013-05-28 DIAGNOSIS — E7800 Pure hypercholesterolemia, unspecified: Secondary | ICD-10-CM | POA: Diagnosis not present

## 2013-05-28 DIAGNOSIS — Z0000 Encounter for general adult medical examination without abnormal findings: Secondary | ICD-10-CM | POA: Diagnosis not present

## 2013-05-28 DIAGNOSIS — N429 Disorder of prostate, unspecified: Secondary | ICD-10-CM | POA: Diagnosis not present

## 2013-05-28 DIAGNOSIS — Z125 Encounter for screening for malignant neoplasm of prostate: Secondary | ICD-10-CM | POA: Diagnosis not present

## 2013-05-28 DIAGNOSIS — Z1331 Encounter for screening for depression: Secondary | ICD-10-CM | POA: Diagnosis not present

## 2013-05-28 DIAGNOSIS — D497 Neoplasm of unspecified behavior of endocrine glands and other parts of nervous system: Secondary | ICD-10-CM | POA: Diagnosis not present

## 2013-05-30 ENCOUNTER — Other Ambulatory Visit: Payer: Self-pay | Admitting: Family Medicine

## 2013-05-30 DIAGNOSIS — E079 Disorder of thyroid, unspecified: Secondary | ICD-10-CM

## 2013-05-30 DIAGNOSIS — N4289 Other specified disorders of prostate: Secondary | ICD-10-CM

## 2013-05-30 NOTE — Telephone Encounter (Signed)
[facility]oke with [facility] advised that patient is prescribed 20 mg of Crestor and take 1/2 tablet daily. [facility] received a Rx from Dr. Nicholos Johns for Crestor 20 mg 1 tablet daily. Per Dr. Nicholos Johns note in the medication list it states 1/2 tablet daily but under refill section it states 1 tablet daily of Crestor 20 mg. Called patient to clarify what he was taking.

## 2013-06-03 ENCOUNTER — Ambulatory Visit
Admission: RE | Admit: 2013-06-03 | Discharge: 2013-06-03 | Disposition: A | Discharge status: Home / Self Care | Payer: Medicare Other | Source: Ambulatory Visit | Attending: Family Medicine | Admitting: Family Medicine

## 2013-06-03 DIAGNOSIS — N509 Disorder of male genital organs, unspecified: Secondary | ICD-10-CM | POA: Diagnosis not present

## 2013-06-03 DIAGNOSIS — N4289 Other specified disorders of prostate: Secondary | ICD-10-CM

## 2013-06-03 DIAGNOSIS — E0789 Other specified disorders of thyroid: Secondary | ICD-10-CM | POA: Diagnosis not present

## 2013-06-03 DIAGNOSIS — E079 Disorder of thyroid, unspecified: Secondary | ICD-10-CM

## 2013-06-11 DIAGNOSIS — Z125 Encounter for screening for malignant neoplasm of prostate: Secondary | ICD-10-CM | POA: Diagnosis not present

## 2013-06-12 ENCOUNTER — Other Ambulatory Visit: Payer: Self-pay | Admitting: Family Medicine

## 2013-06-12 DIAGNOSIS — E041 Nontoxic single thyroid nodule: Secondary | ICD-10-CM

## 2013-06-18 ENCOUNTER — Inpatient Hospital Stay
Admission: RE | Admit: 2013-06-18 | Discharge: 2013-06-18 | Disposition: A | Discharge status: Home / Self Care | Payer: Medicare Other | Source: Ambulatory Visit | Attending: Family Medicine | Admitting: Family Medicine

## 2013-06-24 DIAGNOSIS — J019 Acute sinusitis, unspecified: Secondary | ICD-10-CM | POA: Diagnosis not present

## 2013-06-26 ENCOUNTER — Other Ambulatory Visit: Payer: Medicare Other

## 2013-07-22 ENCOUNTER — Other Ambulatory Visit (HOSPITAL_COMMUNITY)
Admission: RE | Admit: 2013-07-22 | Discharge: 2013-07-22 | Disposition: A | Discharge status: Home / Self Care | Payer: Medicare Other | Source: Ambulatory Visit | Attending: Interventional Radiology | Admitting: Interventional Radiology

## 2013-07-22 ENCOUNTER — Ambulatory Visit
Admission: RE | Admit: 2013-07-22 | Discharge: 2013-07-22 | Disposition: A | Discharge status: Home / Self Care | Payer: Medicare Other | Source: Ambulatory Visit | Attending: Family Medicine | Admitting: Family Medicine

## 2013-07-22 DIAGNOSIS — E041 Nontoxic single thyroid nodule: Secondary | ICD-10-CM

## 2013-08-05 DIAGNOSIS — I10 Essential (primary) hypertension: Secondary | ICD-10-CM | POA: Diagnosis not present

## 2013-08-05 DIAGNOSIS — L299 Pruritus, unspecified: Secondary | ICD-10-CM | POA: Diagnosis not present

## 2013-08-05 DIAGNOSIS — D485 Neoplasm of uncertain behavior of skin: Secondary | ICD-10-CM | POA: Diagnosis not present

## 2013-08-08 DIAGNOSIS — L570 Actinic keratosis: Secondary | ICD-10-CM | POA: Diagnosis not present

## 2013-08-08 DIAGNOSIS — C44721 Squamous cell carcinoma of skin of unspecified lower limb, including hip: Secondary | ICD-10-CM | POA: Diagnosis not present

## 2013-08-08 DIAGNOSIS — D235 Other benign neoplasm of skin of trunk: Secondary | ICD-10-CM | POA: Diagnosis not present

## 2013-08-10 ENCOUNTER — Other Ambulatory Visit: Payer: Self-pay | Admitting: *Deleted

## 2013-08-10 DIAGNOSIS — E7800 Pure hypercholesterolemia, unspecified: Secondary | ICD-10-CM

## 2013-09-10 ENCOUNTER — Ambulatory Visit: Payer: Medicare Other | Admitting: Cardiology

## 2013-09-11 DIAGNOSIS — Z85828 Personal history of other malignant neoplasm of skin: Secondary | ICD-10-CM | POA: Diagnosis not present

## 2013-09-15 ENCOUNTER — Ambulatory Visit (INDEPENDENT_AMBULATORY_CARE_PROVIDER_SITE_OTHER): Payer: Medicare Other | Admitting: Cardiology

## 2013-09-15 ENCOUNTER — Encounter: Payer: Self-pay | Admitting: Cardiology

## 2013-09-15 VITALS — BP 138/50 | HR 66 | Ht 68.5 in | Wt 199.0 lb

## 2013-09-15 DIAGNOSIS — I2510 Atherosclerotic heart disease of native coronary artery without angina pectoris: Secondary | ICD-10-CM | POA: Diagnosis not present

## 2013-09-15 DIAGNOSIS — I10 Essential (primary) hypertension: Secondary | ICD-10-CM

## 2013-09-15 DIAGNOSIS — E785 Hyperlipidemia, unspecified: Secondary | ICD-10-CM | POA: Diagnosis not present
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
## 2013-09-15 NOTE — Patient Instructions (Signed)
Your physician recommends that you continue on your current medications as directed. Please refer to the Current Medication list given to you today.  Your physician wants you to follow-up in: 1 year with Dr. Skains. You will receive a reminder letter in the mail two months in advance. If you don't receive a letter, please call our office to schedule the follow-up appointment.  

## 2013-09-19 ENCOUNTER — Other Ambulatory Visit: Payer: Medicare Other

## 2013-10-01 DIAGNOSIS — H6120 Impacted cerumen, unspecified ear: Secondary | ICD-10-CM | POA: Diagnosis not present

## 2013-11-18 DIAGNOSIS — K5900 Constipation, unspecified: Secondary | ICD-10-CM | POA: Diagnosis not present

## 2013-11-18 DIAGNOSIS — K2990 Gastroduodenitis, unspecified, without bleeding: Secondary | ICD-10-CM | POA: Diagnosis not present

## 2013-11-18 DIAGNOSIS — K2970 Gastritis, unspecified, without bleeding: Secondary | ICD-10-CM | POA: Diagnosis not present

## 2013-11-27 DIAGNOSIS — H01009 Unspecified blepharitis unspecified eye, unspecified eyelid: Secondary | ICD-10-CM | POA: Diagnosis not present

## 2013-11-27 DIAGNOSIS — H40019 Open angle with borderline findings, low risk, unspecified eye: Secondary | ICD-10-CM | POA: Diagnosis not present

## 2013-11-27 DIAGNOSIS — H33309 Unspecified retinal break, unspecified eye: Secondary | ICD-10-CM | POA: Diagnosis not present

## 2013-11-27 DIAGNOSIS — Z961 Presence of intraocular lens: Secondary | ICD-10-CM | POA: Diagnosis not present

## 2013-12-03 ENCOUNTER — Other Ambulatory Visit: Payer: Self-pay | Admitting: Family Medicine

## 2013-12-03 ENCOUNTER — Encounter: Payer: Self-pay | Admitting: Cardiology

## 2013-12-03 DIAGNOSIS — I10 Essential (primary) hypertension: Secondary | ICD-10-CM | POA: Diagnosis not present

## 2013-12-03 DIAGNOSIS — R109 Unspecified abdominal pain: Secondary | ICD-10-CM | POA: Diagnosis not present

## 2013-12-03 DIAGNOSIS — R1013 Epigastric pain: Secondary | ICD-10-CM

## 2013-12-03 DIAGNOSIS — E785 Hyperlipidemia, unspecified: Secondary | ICD-10-CM | POA: Diagnosis not present

## 2013-12-04 DIAGNOSIS — H43819 Vitreous degeneration, unspecified eye: Secondary | ICD-10-CM | POA: Diagnosis not present

## 2013-12-04 DIAGNOSIS — H33309 Unspecified retinal break, unspecified eye: Secondary | ICD-10-CM | POA: Diagnosis not present

## 2013-12-08 ENCOUNTER — Ambulatory Visit
Admission: RE | Admit: 2013-12-08 | Discharge: 2013-12-08 | Disposition: A | Discharge status: Home / Self Care | Payer: Medicare Other | Source: Ambulatory Visit | Attending: Family Medicine | Admitting: Family Medicine

## 2013-12-08 DIAGNOSIS — N281 Cyst of kidney, acquired: Secondary | ICD-10-CM | POA: Diagnosis not present

## 2013-12-08 DIAGNOSIS — R1013 Epigastric pain: Secondary | ICD-10-CM

## 2013-12-08 DIAGNOSIS — K5730 Diverticulosis of large intestine without perforation or abscess without bleeding: Secondary | ICD-10-CM | POA: Diagnosis not present

## 2013-12-08 MED ORDER — IOHEXOL 300 MG/ML  SOLN
100.0000 mL | Freq: Once | INTRAMUSCULAR | Status: AC | PRN
  Administered 2013-12-08: 100 mL via INTRAVENOUS

## 2013-12-11 DIAGNOSIS — L738 Other specified follicular disorders: Secondary | ICD-10-CM | POA: Diagnosis not present

## 2013-12-11 DIAGNOSIS — Z85828 Personal history of other malignant neoplasm of skin: Secondary | ICD-10-CM | POA: Diagnosis not present

## 2014-01-01 DIAGNOSIS — H33309 Unspecified retinal break, unspecified eye: Secondary | ICD-10-CM | POA: Diagnosis not present

## 2014-02-12 ENCOUNTER — Encounter: Payer: Self-pay | Admitting: Cardiology

## 2014-03-06 DIAGNOSIS — I872 Venous insufficiency (chronic) (peripheral): Secondary | ICD-10-CM | POA: Diagnosis not present

## 2014-03-06 DIAGNOSIS — L570 Actinic keratosis: Secondary | ICD-10-CM | POA: Diagnosis not present

## 2014-04-21 DIAGNOSIS — K219 Gastro-esophageal reflux disease without esophagitis: Secondary | ICD-10-CM | POA: Diagnosis not present

## 2014-04-21 DIAGNOSIS — R0602 Shortness of breath: Secondary | ICD-10-CM | POA: Diagnosis not present

## 2014-04-21 DIAGNOSIS — I2510 Atherosclerotic heart disease of native coronary artery without angina pectoris: Secondary | ICD-10-CM | POA: Diagnosis not present

## 2014-04-21 DIAGNOSIS — R609 Edema, unspecified: Secondary | ICD-10-CM | POA: Diagnosis not present

## 2014-04-21 DIAGNOSIS — K5900 Constipation, unspecified: Secondary | ICD-10-CM | POA: Diagnosis not present

## 2014-04-22 ENCOUNTER — Ambulatory Visit
Admission: RE | Admit: 2014-04-22 | Discharge: 2014-04-22 | Disposition: A | Discharge status: Home / Self Care | Payer: Medicare Other | Source: Ambulatory Visit | Attending: Family Medicine | Admitting: Family Medicine

## 2014-04-22 ENCOUNTER — Other Ambulatory Visit: Payer: Self-pay | Admitting: Family Medicine

## 2014-04-22 DIAGNOSIS — R0602 Shortness of breath: Secondary | ICD-10-CM

## 2014-04-22 DIAGNOSIS — J8489 Other specified interstitial pulmonary diseases: Secondary | ICD-10-CM | POA: Diagnosis not present

## 2014-04-30 ENCOUNTER — Ambulatory Visit (INDEPENDENT_AMBULATORY_CARE_PROVIDER_SITE_OTHER): Payer: Medicare Other | Admitting: Cardiology

## 2014-04-30 ENCOUNTER — Encounter: Payer: Self-pay | Admitting: Cardiology

## 2014-04-30 VITALS — BP 130/66 | HR 60 | Ht 68.0 in

## 2014-04-30 DIAGNOSIS — I2510 Atherosclerotic heart disease of native coronary artery without angina pectoris: Secondary | ICD-10-CM | POA: Insufficient documentation

## 2014-04-30 DIAGNOSIS — E785 Hyperlipidemia, unspecified: Secondary | ICD-10-CM

## 2014-04-30 DIAGNOSIS — I10 Essential (primary) hypertension: Secondary | ICD-10-CM | POA: Diagnosis not present

## 2014-04-30 DIAGNOSIS — E65 Localized adiposity: Secondary | ICD-10-CM | POA: Insufficient documentation

## 2014-04-30 DIAGNOSIS — Z23 Encounter for immunization: Secondary | ICD-10-CM | POA: Diagnosis not present

## 2014-04-30 DIAGNOSIS — R0600 Dyspnea, unspecified: Secondary | ICD-10-CM

## 2014-04-30 MED ORDER — FUROSEMIDE 20 MG PO TABS: 20.0000 mg | ORAL_TABLET | Freq: Every day | ORAL | Status: DC | PRN
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
## 2014-04-30 NOTE — Patient Instructions (Signed)
The current medical regimen is effective;  continue present plan and medications.  Follow up in 2 months with Dr Marlou Porch.

## 2014-05-25 DIAGNOSIS — H40013 Open angle with borderline findings, low risk, bilateral: Secondary | ICD-10-CM | POA: Diagnosis not present

## 2014-06-22 DIAGNOSIS — H40013 Open angle with borderline findings, low risk, bilateral: Secondary | ICD-10-CM | POA: Diagnosis not present

## 2014-06-25 ENCOUNTER — Ambulatory Visit (INDEPENDENT_AMBULATORY_CARE_PROVIDER_SITE_OTHER): Payer: Medicare Other | Admitting: Cardiology

## 2014-06-25 ENCOUNTER — Encounter: Payer: Self-pay | Admitting: Cardiology

## 2014-06-25 VITALS — BP 140/74 | HR 71 | Ht 68.0 in | Wt 201.0 lb

## 2014-06-25 DIAGNOSIS — M25473 Effusion, unspecified ankle: Secondary | ICD-10-CM | POA: Insufficient documentation

## 2014-06-25 DIAGNOSIS — R609 Edema, unspecified: Secondary | ICD-10-CM | POA: Diagnosis not present

## 2014-06-25 DIAGNOSIS — E785 Hyperlipidemia, unspecified: Secondary | ICD-10-CM

## 2014-06-25 DIAGNOSIS — I10 Essential (primary) hypertension: Secondary | ICD-10-CM | POA: Diagnosis not present

## 2014-06-25 DIAGNOSIS — I2583 Coronary atherosclerosis due to lipid rich plaque: Principal | ICD-10-CM

## 2014-06-25 DIAGNOSIS — I2510 Atherosclerotic heart disease of native coronary artery without angina pectoris: Secondary | ICD-10-CM | POA: Insufficient documentation

## 2014-06-25 NOTE — Patient Instructions (Signed)
The current medical regimen is effective;  continue present plan and medications.  Follow up in 6 months with Dr. Skains.  You will receive a letter in the mail 2 months before you are due.  Please call us when you receive this letter to schedule your follow up appointment.  
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
## 2014-06-29 DIAGNOSIS — Z1389 Encounter for screening for other disorder: Secondary | ICD-10-CM | POA: Diagnosis not present

## 2014-06-29 DIAGNOSIS — K219 Gastro-esophageal reflux disease without esophagitis: Secondary | ICD-10-CM | POA: Diagnosis not present

## 2014-06-29 DIAGNOSIS — E780 Pure hypercholesterolemia: Secondary | ICD-10-CM | POA: Diagnosis not present

## 2014-06-29 DIAGNOSIS — Z0000 Encounter for general adult medical examination without abnormal findings: Secondary | ICD-10-CM | POA: Diagnosis not present

## 2014-06-29 DIAGNOSIS — I10 Essential (primary) hypertension: Secondary | ICD-10-CM | POA: Diagnosis not present

## 2014-06-29 DIAGNOSIS — Z23 Encounter for immunization: Secondary | ICD-10-CM | POA: Diagnosis not present

## 2014-08-03 DIAGNOSIS — I10 Essential (primary) hypertension: Secondary | ICD-10-CM | POA: Diagnosis not present

## 2014-10-20 DIAGNOSIS — H01001 Unspecified blepharitis right upper eyelid: Secondary | ICD-10-CM | POA: Diagnosis not present

## 2014-10-20 DIAGNOSIS — H4011X1 Primary open-angle glaucoma, mild stage: Secondary | ICD-10-CM | POA: Diagnosis not present

## 2014-10-20 DIAGNOSIS — H33302 Unspecified retinal break, left eye: Secondary | ICD-10-CM | POA: Diagnosis not present

## 2014-10-20 DIAGNOSIS — Z961 Presence of intraocular lens: Secondary | ICD-10-CM | POA: Diagnosis not present

## 2014-12-31 DIAGNOSIS — E780 Pure hypercholesterolemia: Secondary | ICD-10-CM | POA: Diagnosis not present

## 2014-12-31 DIAGNOSIS — K219 Gastro-esophageal reflux disease without esophagitis: Secondary | ICD-10-CM | POA: Diagnosis not present

## 2014-12-31 DIAGNOSIS — R609 Edema, unspecified: Secondary | ICD-10-CM | POA: Diagnosis not present

## 2014-12-31 DIAGNOSIS — I10 Essential (primary) hypertension: Secondary | ICD-10-CM | POA: Diagnosis not present

## 2014-12-31 DIAGNOSIS — Z1389 Encounter for screening for other disorder: Secondary | ICD-10-CM | POA: Diagnosis not present

## 2015-02-08 DIAGNOSIS — H6122 Impacted cerumen, left ear: Secondary | ICD-10-CM | POA: Diagnosis not present

## 2015-02-12 DIAGNOSIS — K589 Irritable bowel syndrome without diarrhea: Secondary | ICD-10-CM | POA: Diagnosis not present

## 2015-02-12 DIAGNOSIS — R1084 Generalized abdominal pain: Secondary | ICD-10-CM | POA: Diagnosis not present

## 2015-03-23 DIAGNOSIS — Z961 Presence of intraocular lens: Secondary | ICD-10-CM | POA: Diagnosis not present

## 2015-03-23 DIAGNOSIS — H43813 Vitreous degeneration, bilateral: Secondary | ICD-10-CM | POA: Diagnosis not present

## 2015-03-23 DIAGNOSIS — H35033 Hypertensive retinopathy, bilateral: Secondary | ICD-10-CM | POA: Diagnosis not present

## 2015-03-23 DIAGNOSIS — H40053 Ocular hypertension, bilateral: Secondary | ICD-10-CM | POA: Diagnosis not present

## 2015-04-14 DIAGNOSIS — Z23 Encounter for immunization: Secondary | ICD-10-CM | POA: Diagnosis not present

## 2015-07-22 DIAGNOSIS — K219 Gastro-esophageal reflux disease without esophagitis: Secondary | ICD-10-CM | POA: Diagnosis not present

## 2015-07-22 DIAGNOSIS — R609 Edema, unspecified: Secondary | ICD-10-CM | POA: Diagnosis not present

## 2015-07-22 DIAGNOSIS — I2510 Atherosclerotic heart disease of native coronary artery without angina pectoris: Secondary | ICD-10-CM | POA: Diagnosis not present

## 2015-07-22 DIAGNOSIS — I10 Essential (primary) hypertension: Secondary | ICD-10-CM | POA: Diagnosis not present

## 2015-07-22 DIAGNOSIS — Z0000 Encounter for general adult medical examination without abnormal findings: Secondary | ICD-10-CM | POA: Diagnosis not present

## 2015-07-22 DIAGNOSIS — E7800 Pure hypercholesterolemia, unspecified: Secondary | ICD-10-CM | POA: Diagnosis not present

## 2015-08-12 DIAGNOSIS — E860 Dehydration: Secondary | ICD-10-CM | POA: Diagnosis not present

## 2015-09-08 ENCOUNTER — Encounter: Payer: Self-pay | Admitting: *Deleted

## 2015-09-13 ENCOUNTER — Encounter: Payer: Self-pay | Admitting: Cardiology

## 2015-09-13 ENCOUNTER — Ambulatory Visit (INDEPENDENT_AMBULATORY_CARE_PROVIDER_SITE_OTHER): Payer: Medicare Other | Admitting: Cardiology

## 2015-09-13 VITALS — BP 120/62 | HR 54 | Ht 69.0 in | Wt 208.4 lb

## 2015-09-13 DIAGNOSIS — I2510 Atherosclerotic heart disease of native coronary artery without angina pectoris: Secondary | ICD-10-CM

## 2015-09-13 DIAGNOSIS — I10 Essential (primary) hypertension: Secondary | ICD-10-CM

## 2015-09-13 DIAGNOSIS — E65 Localized adiposity: Secondary | ICD-10-CM

## 2015-09-13 DIAGNOSIS — E785 Hyperlipidemia, unspecified: Secondary | ICD-10-CM

## 2015-09-13 NOTE — Patient Instructions (Signed)

## 2015-09-13 NOTE — Progress Notes (Signed)
[facility]. [address], [address] [place], [place]  [address] Phone: [phone] Fax:  [phone]  Date:  09/13/2015   ID:  ABISHAI SUNDERHAUS, DOB 1928/10/14, [name], [name], prior history of smoking, hypertension, hyperlipidemia here for followup. Overall doing very well, no angina, no bleeding, no syncope.   He is still having ongoing shortness of breath with activity however he is now up to 208 pounds. He now takes his Lasix when necessary after his lab work was checked which showed increased creatinine. He has compression socks which of been very helpful.  Main complaint happens to be posterior neck pain, low back and hip pain. This does not enable him to exercise as much his usual.  Wt Readings from Last 3 Encounters:  09/13/15 208 lb 6.4 oz (94.53 kg)  06/25/14 201 lb (91.173 kg)  09/15/13 199 lb (90.266 kg)     Past Medical History  Diagnosis Date  . Hypertension   . Hyperlipidemia   . Spleen injury   . Hypercholesteremia   . CAD (coronary artery disease)     Past Surgical History  Procedure Laterality Date  . Coronary stent placement    . Cholecystectomy    . Thyroid surgery      growth removed    Current Outpatient Prescriptions  Medication Sig Dispense Refill  . aspirin EC 81 MG tablet Take 81 mg by mouth at bedtime.    . Cholecalciferol (VITAMIN D-3) 1000 UNITS CAPS Take 2,000 Units by mouth daily.    . Coenzyme Q10 (CO Q 10) 10 MG CAPS Take 10 mg by mouth daily.    . fish oil-omega-3 fatty acids 1000 MG capsule Take 1 g by mouth daily.    . furosemide (LASIX) 20 MG tablet Take 20 mg by mouth as directed.    . metoprolol tartrate (LOPRESSOR) 25 MG tablet Take 25 mg by mouth 2 (two) times daily.  1  . rosuvastatin (CRESTOR) 20 MG tablet Take 10 mg by mouth at bedtime.    .  valsartan-hydrochlorothiazide (DIOVAN-HCT) 320-25 MG per tablet Take 1 tablet by mouth daily.      No current facility-administered medications for this visit.    Allergies:    Allergies  Allergen Reactions  . Morphine And Related     'made him go crazy, made his insides hurt'  . Avelox [Moxifloxacin Hcl In Nacl] Other (See Comments)    Dizziness    Social History:  The patient  reports that he has never smoked. He does not have any smokeless tobacco history on file. He reports that he does not drink alcohol or use illicit drugs.   ROS:  Please see the history of present illness.     PHYSICAL EXAM: VS:  BP 120/62 mmHg  Pulse 54  Ht 5\' 9"  (1.753 m)  Wt 208 lb 6.4 oz (94.53 kg)  BMI 30.76 kg/m2  SpO2 94% Well nourished, well developed, in no acute distress HEENT: normal Neck: no JVD Cardiac:  normal S1, S2; RRR; no murmur Lungs:  clear to auscultation bilaterally, no wheezing, rhonchi or rales Abd: soft, nontender, no hepatomegalyDistended abdomen Ext: mild ankle edema Skin: warm and dry Neuro: no focal abnormalities noted  EKG:  EKG was ordered today 09/13/15-sinus rhythm, 53, no other abnormalities.04/30/14-sinus bradycardia no change. Labs: 2014-LDL  62    ASSESSMENT AND PLAN:  1. CAD-doing well, 2001 intervention. No anginal symptoms. Aspirin, statin. Secondary prevention. 2. Hyperlipidemia-continue with Crestor 10 mg. Dr. Alyson Ingles has been checking. 3. Hypertension-currently well controlled. Diovan HCT. 4. obesity-continue to encourage weight loss. This is definitely leading to increased shortness of breath as well. 6 month follow-up Signed, Candee Furbish, MD [facility]  09/13/2015 1:50 PM

## 2015-09-30 DIAGNOSIS — H6062 Unspecified chronic otitis externa, left ear: Secondary | ICD-10-CM | POA: Diagnosis not present

## 2015-09-30 DIAGNOSIS — J301 Allergic rhinitis due to pollen: Secondary | ICD-10-CM | POA: Diagnosis not present

## 2015-09-30 DIAGNOSIS — H6122 Impacted cerumen, left ear: Secondary | ICD-10-CM | POA: Diagnosis not present

## 2015-10-29 ENCOUNTER — Telehealth: Payer: Self-pay | Admitting: Cardiology

## 2015-10-29 NOTE — Telephone Encounter (Signed)
Pt c/o Shortness Of Breath: STAT if SOB developed within the last 24 hours or pt is noticeably SOB on the phone  1. Are you currently SOB (can you hear that pt is SOB on the phone)? no 2. How long have you been experiencing SOB? Since march and it has increased  3. Are you SOB when sitting or when up moving around? both 4. Are you currently experiencing any other symptoms? no

## 2015-10-29 NOTE — Telephone Encounter (Signed)
Spoke with pt who states he has still been having SOB since before his last appt.  Advised to wt daily and keep a record.  Advised to decrease NA in diet and reviewed foods to avoid.  Advised to continue wearing compression stockings daily and elevate feet and legs as much as possible. Advised OK to take Furosemide 20 mg prn for SOB.  He has been scheduled to f/u on Tuesday with Dr Marlou Porch.

## 2015-11-02 ENCOUNTER — Encounter: Payer: Self-pay | Admitting: Cardiology

## 2015-11-02 ENCOUNTER — Ambulatory Visit (INDEPENDENT_AMBULATORY_CARE_PROVIDER_SITE_OTHER): Payer: Medicare Other | Admitting: Cardiology

## 2015-11-02 VITALS — BP 128/66 | HR 53 | Ht 68.5 in | Wt 199.4 lb

## 2015-11-02 DIAGNOSIS — E785 Hyperlipidemia, unspecified: Secondary | ICD-10-CM | POA: Diagnosis not present

## 2015-11-02 DIAGNOSIS — I2583 Coronary atherosclerosis due to lipid rich plaque: Secondary | ICD-10-CM

## 2015-11-02 DIAGNOSIS — R0789 Other chest pain: Secondary | ICD-10-CM | POA: Diagnosis not present

## 2015-11-02 DIAGNOSIS — I2510 Atherosclerotic heart disease of native coronary artery without angina pectoris: Secondary | ICD-10-CM

## 2015-11-02 DIAGNOSIS — I10 Essential (primary) hypertension: Secondary | ICD-10-CM | POA: Diagnosis not present

## 2015-11-02 MED ORDER — FUROSEMIDE 40 MG PO TABS: 40.0000 mg | ORAL_TABLET | Freq: Every day | ORAL | Status: DC

## 2015-11-02 NOTE — Patient Instructions (Signed)
Medication Instructions:  Your physician has recommended you make the following change in your medication:  INCREASE Lasix to 40mg  daily. An Rx has been sent to your pharmacy   Labwork: Your physician recommends that you return for lab work in: 1 week (Bmet)   Testing/Procedures: Your physician has requested that you have a lexiscan myoview. For further information please visit [email or web address]. Please follow instruction sheet, as given.    Follow-Up: Your physician recommends that you schedule a follow-up appointment in: 4 weeks with Dr.Skains   Any Other Special Instructions Will Be Listed Below (If Applicable).     If you need a refill on your cardiac medications before your next appointment, please call your pharmacy.

## 2015-11-02 NOTE — Progress Notes (Signed)
[facility]. [address] [place], [place]  [address] Phone: [phone] Fax:  [phone]  Date:  11/02/2015   ID:  [name], DOB [birth year], MRN [id number]  PCP:  Vena Austria, [name], prior history of smoking, hypertension, hyperlipidemia here for followup.  no bleeding, no syncope.   He is still having ongoing shortness of breath with activity however he is now up to 208 pounds. He now takes his Lasix when necessary after his lab work was checked which showed increased creatinine. He has compression socks which of been very helpful.  Main complaint happens to be posterior neck pain, low back and hip pain. This does not enable him to exercise as much his usual.  When lay down after getting up to bathroom he is short of breath. Breaths better on left side. Goes away after a min. He bought a new mattress. His voice fades out after talking.  Wt Readings from Last 3 Encounters:  11/02/15 199 lb 6.4 oz (90.447 kg)  09/13/15 208 lb 6.4 oz (94.53 kg)  06/25/14 201 lb (91.173 kg)     Past Medical History  Diagnosis Date  . Hypertension   . Hyperlipidemia   . Spleen injury   . Hypercholesteremia   . CAD (coronary artery disease)     Past Surgical History  Procedure Laterality Date  . Coronary stent placement    . Cholecystectomy    . Thyroid surgery      growth removed    Current Outpatient Prescriptions  Medication Sig Dispense Refill  . aspirin EC 81 MG tablet Take 81 mg by mouth at bedtime.    . Cholecalciferol (VITAMIN D-3) 1000 UNITS CAPS Take 2,000 Units by mouth daily.    . Coenzyme Q10 (CO Q 10) 10 MG CAPS Take 10 mg by mouth daily.    . fexofenadine (ALLEGRA) 180 MG tablet Take 180 mg by mouth as directed.  0  . fish oil-omega-3 fatty acids 1000 MG capsule Take 1 g by mouth daily.    . furosemide  (LASIX) 20 MG tablet Take 20 mg by mouth as needed.    . latanoprost (XALATAN) 0.005 % ophthalmic solution Place 1 drop into both eyes daily.  5  . metoprolol tartrate (LOPRESSOR) 25 MG tablet Take 25 mg by mouth 2 (two) times daily.  1  . rosuvastatin (CRESTOR) 20 MG tablet Take 10 mg by mouth at bedtime.    . valsartan-hydrochlorothiazide (DIOVAN-HCT) 320-25 MG per tablet Take 1 tablet by mouth daily.      No current facility-administered medications for this visit.    Allergies:    Allergies  Allergen Reactions  . Morphine And Related     'made him go crazy, made his insides hurt'  . Avelox [Moxifloxacin Hcl In Nacl] Other (See Comments)    Dizziness    Social History:  The patient  reports that he has never smoked. He does not have any smokeless tobacco history on file. He reports that he does not drink alcohol or use illicit drugs.   ROS:  Please see the history of present illness.     PHYSICAL EXAM: VS:  BP 128/66 mmHg  Pulse 53  Ht 5' 8.5" (1.74 m)  Wt 199 lb 6.4 oz (90.447 kg)  BMI 29.87 kg/m2  SpO2 95% Well nourished, well developed,  in no acute distress HEENT: normal Neck: no JVD Cardiac:  normal S1, S2; RRR; no murmur Lungs:  clear to auscultation bilaterally, no wheezing, rhonchi or rales Abd: soft, nontender, no hepatomegalyDistended abdomen Ext: mild ankle edemacompression hose Skin: warm and dry Neuro: no focal abnormalities noted  EKG:  EKG was ordered today 09/13/15-sinus rhythm, 53, no other abnormalities.04/30/14-sinus bradycardia no change.  Labs: 2014-LDL 62    ASSESSMENT AND PLAN:  1. CAD-still having ongoing dyspnea on exertion, 2001 intervention, Dr. Leonia Reeves. No anginal symptoms unless shortness of breath equivalent. Aspirin, statin. Secondary prevention. Since he is having his dyspnea on exertion, and prior coronary artery disease, I will check a pharmacologic stress test to ensure that he doesn't have any high risk ischemia area I will also add 40  mg of Lasix daily to his regimen. He noted that when taking the 20 mg with his other medication which include ARB/HCT combination, there was no increase in urination. I will check a basic metabolic profile in one week. I will see him back in one month.  2. Hyperlipidemia-continue with Crestor 10 mg. Dr. Alyson Ingles has been checking. 3. Hypertension-currently well controlled. Diovan HCT. obesity-continue to encourage weight loss.   1 month follow-up Signed, Candee Furbish, MD [facility]  11/02/2015 8:44 AM

## 2015-11-09 ENCOUNTER — Telehealth (HOSPITAL_COMMUNITY): Payer: Self-pay | Admitting: *Deleted

## 2015-11-09 NOTE — Telephone Encounter (Signed)
Patient given detailed instructions per Myocardial Perfusion Study Information Sheet for the test on 11/12/15. Patient notified to arrive 15 minutes early and that it is imperative to arrive on time for appointment to keep from having the test rescheduled.  If you need to cancel or reschedule your appointment, please call the office within 24 hours of your appointment. Failure to do so may result in a cancellation of your appointment, and a $50 no show fee. Patient verbalized understanding.Naida Escalante J Macyn Remmert, RN  

## 2015-11-12 ENCOUNTER — Ambulatory Visit (INDEPENDENT_AMBULATORY_CARE_PROVIDER_SITE_OTHER): Payer: Medicare Other | Admitting: *Deleted

## 2015-11-12 ENCOUNTER — Ambulatory Visit (HOSPITAL_COMMUNITY): Payer: Medicare Other | Attending: Cardiology

## 2015-11-12 DIAGNOSIS — R0602 Shortness of breath: Secondary | ICD-10-CM | POA: Diagnosis not present

## 2015-11-12 DIAGNOSIS — I2510 Atherosclerotic heart disease of native coronary artery without angina pectoris: Secondary | ICD-10-CM | POA: Insufficient documentation

## 2015-11-12 DIAGNOSIS — R0789 Other chest pain: Secondary | ICD-10-CM

## 2015-11-12 DIAGNOSIS — R079 Chest pain, unspecified: Secondary | ICD-10-CM

## 2015-11-12 DIAGNOSIS — R9439 Abnormal result of other cardiovascular function study: Secondary | ICD-10-CM | POA: Insufficient documentation

## 2015-11-12 DIAGNOSIS — I10 Essential (primary) hypertension: Secondary | ICD-10-CM | POA: Insufficient documentation

## 2015-11-12 LAB — MYOCARDIAL PERFUSION IMAGING
CHL CUP NUCLEAR SDS: 5
CHL CUP RESTING HR STRESS: 49 {beats}/min
CSEPPHR: 71 {beats}/min
LVDIAVOL: 106 mL (ref 62–150)
LVSYSVOL: 46 mL
RATE: 0.1
SRS: 5
SSS: 7
TID: 1

## 2015-11-12 LAB — BASIC METABOLIC PANEL
BUN: 35 mg/dL — AB (ref 7–25)
CHLORIDE: 101 mmol/L (ref 98–110)
CO2: 31 mmol/L (ref 20–31)
CREATININE: 1.5 mg/dL — AB (ref 0.70–1.11)
Calcium: 8.9 mg/dL (ref 8.6–10.3)
Glucose, Bld: 96 mg/dL (ref 65–99)
Potassium: 3.9 mmol/L (ref 3.5–5.3)
Sodium: 140 mmol/L (ref 135–146)

## 2015-11-12 MED ORDER — TECHNETIUM TC 99M SESTAMIBI GENERIC - CARDIOLITE
32.8000 | Freq: Once | INTRAVENOUS | Status: AC | PRN
  Administered 2015-11-12: 32.8 via INTRAVENOUS

## 2015-11-12 MED ORDER — TECHNETIUM TC 99M SESTAMIBI GENERIC - CARDIOLITE
10.4000 | Freq: Once | INTRAVENOUS | Status: AC | PRN
  Administered 2015-11-12: 10 via INTRAVENOUS

## 2015-11-12 MED ORDER — REGADENOSON 0.4 MG/5ML IV SOLN
0.4000 mg | Freq: Once | INTRAVENOUS | Status: AC
  Administered 2015-11-12: 0.4 mg via INTRAVENOUS

## 2015-11-12 NOTE — Addendum Note (Signed)
Addended by: Eulis Foster on: 11/12/2015 09:07 AM   Modules accepted: Orders

## 2015-11-23 ENCOUNTER — Telehealth: Payer: Self-pay | Admitting: Cardiology

## 2015-11-23 NOTE — Telephone Encounter (Signed)
Reviewed results of myoview with pt who states understanding 

## 2015-11-23 NOTE — Telephone Encounter (Signed)
New message  Pt called for Myoview results.

## 2015-12-03 ENCOUNTER — Ambulatory Visit (INDEPENDENT_AMBULATORY_CARE_PROVIDER_SITE_OTHER): Payer: Medicare Other | Admitting: Cardiology

## 2015-12-03 ENCOUNTER — Encounter: Payer: Self-pay | Admitting: Cardiology

## 2015-12-03 VITALS — BP 148/66 | HR 72 | Ht 68.0 in | Wt 199.8 lb

## 2015-12-03 DIAGNOSIS — E65 Localized adiposity: Secondary | ICD-10-CM

## 2015-12-03 DIAGNOSIS — I2583 Coronary atherosclerosis due to lipid rich plaque: Principal | ICD-10-CM

## 2015-12-03 DIAGNOSIS — E785 Hyperlipidemia, unspecified: Secondary | ICD-10-CM

## 2015-12-03 DIAGNOSIS — I2510 Atherosclerotic heart disease of native coronary artery without angina pectoris: Secondary | ICD-10-CM | POA: Diagnosis not present

## 2015-12-03 DIAGNOSIS — M25473 Effusion, unspecified ankle: Secondary | ICD-10-CM

## 2015-12-03 DIAGNOSIS — R0600 Dyspnea, unspecified: Secondary | ICD-10-CM

## 2015-12-03 DIAGNOSIS — I10 Essential (primary) hypertension: Secondary | ICD-10-CM

## 2015-12-03 DIAGNOSIS — R609 Edema, unspecified: Secondary | ICD-10-CM

## 2015-12-03 MED ORDER — FUROSEMIDE 40 MG PO TABS: 40.0000 mg | ORAL_TABLET | Freq: Every day | ORAL | Status: DC | PRN

## 2015-12-03 NOTE — Patient Instructions (Signed)
Medication Instructions:  Please take Furosemide only as needed for edema/SOB/increase in weight greater than 2-3 lbs overnight. Continue all other medications as listed.  Please limit your fluid intake to no more than 1.5 liters a day (1,500 mls). Follow-Up: Follow up in 6 months with Dr. Marlou Porch.  You will receive a letter in the mail 2 months before you are due.  Please call us when you receive this letter to schedule your follow up appointment.  If you need a refill on your cardiac medications before your next appointment, please call your pharmacy.  Thank you for choosing [facility]!!

## 2015-12-03 NOTE — Progress Notes (Signed)
[name], [place]  [address] Phone: [phone] Fax:  [phone]  Date:  12/03/2015   ID:  [name], DOB [birth year], [name], [name], prior history of smoking, hypertension, hyperlipidemia here for followup.  no bleeding, no syncope.   Previously with increased shortness of breath, I checked a nuclear stress test and this was reassuring, low risk. I also had increased his Lasix however his creatinine once again went up. Decided to change his to when necessary.  He felt some dizziness in the sunshine, perhaps low blood pressure. This was with the increased Lasix. Creatinine was 1.5, BUN was 35.  Main complaint happens to be posterior neck pain, low back and hip pain. This does not enable him to exercise as much his usual.   Wt Readings from Last 3 Encounters:  12/03/15 199 lb 12.8 oz (90.629 kg)  11/02/15 199 lb 6.4 oz (90.447 kg)  09/13/15 208 lb 6.4 oz (94.53 kg)     Past Medical History  Diagnosis Date  . Hypertension   . Hyperlipidemia   . Spleen injury   . Hypercholesteremia   . CAD (coronary artery disease)     Past Surgical History  Procedure Laterality Date  . Coronary stent placement    . Cholecystectomy    . Thyroid surgery      growth removed    Current Outpatient Prescriptions  Medication Sig Dispense Refill  . aspirin EC 81 MG tablet Take 81 mg by mouth at bedtime.    . Cholecalciferol (VITAMIN D-3) 1000 UNITS CAPS Take 2,000 Units by mouth daily.    . Coenzyme Q10 (CO Q 10) 10 MG CAPS Take 10 mg by mouth daily.    . fexofenadine (ALLEGRA) 180 MG tablet Take 180 mg by mouth as directed.  0  . fish oil-omega-3 fatty acids 1000 MG capsule Take 1 g by mouth daily.    . furosemide (LASIX) 40 MG tablet Take 1 tablet (40 mg total) by mouth  daily. 30 tablet 5  . latanoprost (XALATAN) 0.005 % ophthalmic solution Place 1 drop into both eyes daily.  5  . metoprolol tartrate (LOPRESSOR) 25 MG tablet Take 25 mg by mouth 2 (two) times daily.  1  . rosuvastatin (CRESTOR) 20 MG tablet Take 10 mg by mouth at bedtime.    . valsartan-hydrochlorothiazide (DIOVAN-HCT) 320-25 MG per tablet Take 1 tablet by mouth daily.      No current facility-administered medications for this visit.    Allergies:    Allergies  Allergen Reactions  . Morphine And Related     'made him go crazy, made his insides hurt'  . Avelox [Moxifloxacin Hcl In Nacl] Other (See Comments)    Dizziness    Social History:  The patient  reports that he has never smoked. He does not have any smokeless tobacco history on file. He reports that he does not drink alcohol or use illicit drugs.   ROS:  Please see the history of present illness.     PHYSICAL EXAM: VS:  BP 148/66 mmHg  Pulse 72  Ht 5\' 8"  (1.727 m)  Wt 199 lb 12.8 oz (90.629 kg)  BMI 30.39 kg/m2 Well nourished, well developed, in no acute distress HEENT: normal Neck: no JVD Cardiac:  normal S1, S2;  RRR; no murmur Lungs:  clear to auscultation bilaterally, no wheezing, rhonchi or rales Abd: soft, nontender, no hepatomegalyDistended abdomen Ext: no ankle edemacompression hose Skin: warm and dry Neuro: no focal abnormalities noted  EKG:  EKG was ordered today 09/13/15-sinus rhythm, 53, no other abnormalities.04/30/14-sinus bradycardia no change.  Labs: 2014-LDL 62    NUC: 11/12/15: Normal ejection fraction, no significant ischemia identified. Reassuring, low risk.  ASSESSMENT AND PLAN:  CAD  - Reassuring nuclear stress test as above.  - Shortness of breath, started Lasix previously. Taking 40 mg instead of 20 because of decreased urine output with 20. Remember, he is also on HCTZ.  - Creatinine was 1.5, BUN 35, mild dizziness after being out in the sunshine. He has not seen much increase in urine  output still.  - Change Lasix to 40 mg when necessary, leg swelling. I do not want him to take this on a daily basis. His kidney function is becoming affected.    Hyperlipidemia -continue with Crestor 10 mg. Dr. Alyson Ingles has been checking.  Hypertension -currently well controlled. Diovan HCT.  Obesity -continue to encourage weight loss.   6 month f/u   Signed, Candee Furbish, MD [facility]  12/03/2015 9:12 AM

## 2015-12-09 DIAGNOSIS — Z6839 Body mass index (BMI) 39.0-39.9, adult: Secondary | ICD-10-CM | POA: Diagnosis not present

## 2015-12-09 DIAGNOSIS — M5412 Radiculopathy, cervical region: Secondary | ICD-10-CM | POA: Diagnosis not present

## 2015-12-09 DIAGNOSIS — M5416 Radiculopathy, lumbar region: Secondary | ICD-10-CM | POA: Diagnosis not present

## 2015-12-09 DIAGNOSIS — I10 Essential (primary) hypertension: Secondary | ICD-10-CM | POA: Diagnosis not present

## 2015-12-18 DIAGNOSIS — M5412 Radiculopathy, cervical region: Secondary | ICD-10-CM | POA: Diagnosis not present

## 2015-12-18 DIAGNOSIS — M5416 Radiculopathy, lumbar region: Secondary | ICD-10-CM | POA: Diagnosis not present

## 2015-12-18 DIAGNOSIS — M4806 Spinal stenosis, lumbar region: Secondary | ICD-10-CM | POA: Diagnosis not present

## 2015-12-18 DIAGNOSIS — M50223 Other cervical disc displacement at C6-C7 level: Secondary | ICD-10-CM | POA: Diagnosis not present

## 2015-12-30 DIAGNOSIS — R030 Elevated blood-pressure reading, without diagnosis of hypertension: Secondary | ICD-10-CM | POA: Diagnosis not present

## 2015-12-30 DIAGNOSIS — M47812 Spondylosis without myelopathy or radiculopathy, cervical region: Secondary | ICD-10-CM | POA: Diagnosis not present

## 2015-12-30 DIAGNOSIS — Z6839 Body mass index (BMI) 39.0-39.9, adult: Secondary | ICD-10-CM | POA: Diagnosis not present

## 2016-02-01 DIAGNOSIS — R1084 Generalized abdominal pain: Secondary | ICD-10-CM | POA: Diagnosis not present

## 2016-02-01 DIAGNOSIS — K5900 Constipation, unspecified: Secondary | ICD-10-CM | POA: Diagnosis not present

## 2016-02-04 DIAGNOSIS — K219 Gastro-esophageal reflux disease without esophagitis: Secondary | ICD-10-CM | POA: Diagnosis not present

## 2016-02-04 DIAGNOSIS — E7800 Pure hypercholesterolemia, unspecified: Secondary | ICD-10-CM | POA: Diagnosis not present

## 2016-02-04 DIAGNOSIS — R609 Edema, unspecified: Secondary | ICD-10-CM | POA: Diagnosis not present

## 2016-02-04 DIAGNOSIS — K5900 Constipation, unspecified: Secondary | ICD-10-CM | POA: Diagnosis not present

## 2016-02-04 DIAGNOSIS — I10 Essential (primary) hypertension: Secondary | ICD-10-CM | POA: Diagnosis not present

## 2016-02-04 DIAGNOSIS — R141 Gas pain: Secondary | ICD-10-CM | POA: Diagnosis not present

## 2016-02-04 DIAGNOSIS — I2510 Atherosclerotic heart disease of native coronary artery without angina pectoris: Secondary | ICD-10-CM | POA: Diagnosis not present

## 2016-02-10 ENCOUNTER — Emergency Department (HOSPITAL_COMMUNITY)
Admission: EM | Admit: 2016-02-10 | Discharge: 2016-02-10 | Disposition: A | Discharge status: Home / Self Care | Payer: Medicare Other | Attending: Emergency Medicine | Admitting: Emergency Medicine

## 2016-02-10 ENCOUNTER — Emergency Department (HOSPITAL_COMMUNITY): Payer: Medicare Other

## 2016-02-10 ENCOUNTER — Encounter (HOSPITAL_COMMUNITY): Payer: Self-pay | Admitting: Emergency Medicine

## 2016-02-10 DIAGNOSIS — Z7982 Long term (current) use of aspirin: Secondary | ICD-10-CM | POA: Diagnosis not present

## 2016-02-10 DIAGNOSIS — Z955 Presence of coronary angioplasty implant and graft: Secondary | ICD-10-CM | POA: Insufficient documentation

## 2016-02-10 DIAGNOSIS — I2510 Atherosclerotic heart disease of native coronary artery without angina pectoris: Secondary | ICD-10-CM | POA: Diagnosis not present

## 2016-02-10 DIAGNOSIS — R110 Nausea: Secondary | ICD-10-CM | POA: Diagnosis not present

## 2016-02-10 DIAGNOSIS — Z79899 Other long term (current) drug therapy: Secondary | ICD-10-CM | POA: Insufficient documentation

## 2016-02-10 DIAGNOSIS — I10 Essential (primary) hypertension: Secondary | ICD-10-CM | POA: Insufficient documentation

## 2016-02-10 DIAGNOSIS — R109 Unspecified abdominal pain: Secondary | ICD-10-CM | POA: Diagnosis present

## 2016-02-10 DIAGNOSIS — K5900 Constipation, unspecified: Secondary | ICD-10-CM | POA: Diagnosis not present

## 2016-02-10 LAB — COMPREHENSIVE METABOLIC PANEL
ALT: 20 U/L (ref 17–63)
ANION GAP: 6 (ref 5–15)
AST: 18 U/L (ref 15–41)
Albumin: 4.3 g/dL (ref 3.5–5.0)
Alkaline Phosphatase: 62 U/L (ref 38–126)
BUN: 32 mg/dL — AB (ref 6–20)
CALCIUM: 9.2 mg/dL (ref 8.9–10.3)
CHLORIDE: 103 mmol/L (ref 101–111)
CO2: 30 mmol/L (ref 22–32)
Creatinine, Ser: 1.42 mg/dL — ABNORMAL HIGH (ref 0.61–1.24)
GFR calc Af Amer: 50 mL/min — ABNORMAL LOW (ref >60)
GFR, EST NON AFRICAN AMERICAN: 43 mL/min — AB (ref >60)
Glucose, Bld: 104 mg/dL — ABNORMAL HIGH (ref 65–99)
POTASSIUM: 5 mmol/L (ref 3.5–5.1)
Sodium: 139 mmol/L (ref 135–145)
Total Bilirubin: 0.7 mg/dL (ref 0.3–1.2)
Total Protein: 7.1 g/dL (ref 6.5–8.1)

## 2016-02-10 LAB — URINALYSIS, ROUTINE W REFLEX MICROSCOPIC
BILIRUBIN URINE: NEGATIVE
GLUCOSE, UA: NEGATIVE mg/dL
HGB URINE DIPSTICK: NEGATIVE
Ketones, ur: NEGATIVE mg/dL
Leukocytes, UA: NEGATIVE
Nitrite: NEGATIVE
PH: 5.5 (ref 5.0–8.0)
Protein, ur: NEGATIVE mg/dL
SPECIFIC GRAVITY, URINE: 1.014 (ref 1.005–1.030)

## 2016-02-10 LAB — CBC WITH DIFFERENTIAL/PLATELET
BASOS ABS: 0 10*3/uL (ref 0.0–0.1)
BASOS PCT: 1 %
EOS PCT: 2 %
Eosinophils Absolute: 0.2 10*3/uL (ref 0.0–0.7)
HCT: 37.2 % — ABNORMAL LOW (ref 39.0–52.0)
Hemoglobin: 12.6 g/dL — ABNORMAL LOW (ref 13.0–17.0)
LYMPHS PCT: 26 %
Lymphs Abs: 1.9 10*3/uL (ref 0.7–4.0)
MCH: 31.1 pg (ref 26.0–34.0)
MCHC: 33.9 g/dL (ref 30.0–36.0)
MCV: 91.9 fL (ref 78.0–100.0)
MONO ABS: 0.9 10*3/uL (ref 0.1–1.0)
MONOS PCT: 12 %
Neutro Abs: 4.3 10*3/uL (ref 1.7–7.7)
Neutrophils Relative %: 59 %
PLATELETS: 181 10*3/uL (ref 150–400)
RBC: 4.05 MIL/uL — ABNORMAL LOW (ref 4.22–5.81)
RDW: 12.4 % (ref 11.5–15.5)
WBC: 7.3 10*3/uL (ref 4.0–10.5)

## 2016-02-10 MED ORDER — DIATRIZOATE MEGLUMINE & SODIUM 66-10 % PO SOLN: 30.0000 mL | Freq: Once | ORAL | Status: DC

## 2016-02-10 MED ORDER — IOPAMIDOL (ISOVUE-300) INJECTION 61%
100.0000 mL | Freq: Once | INTRAVENOUS | Status: AC | PRN
  Administered 2016-02-10: 100 mL via INTRAVENOUS

## 2016-02-10 MED ORDER — GLYCERIN (LAXATIVE) 2 G RE SUPP: 1.0000 | Freq: Every day | RECTAL | 0 refills | Status: DC | PRN

## 2016-02-10 NOTE — Discharge Instructions (Signed)
Please read and follow all provided instructions.  Your diagnoses today include:  1. Constipation, unspecified constipation type     Tests performed today include: CT of your abdomen that does not show any serious cause of blockage Blood counts and electrolytes Vital signs. See below for your results today.   Medications prescribed:  Glycerin suppository   Take any medications only as directed on prescription or on packaging.   Home care instructions:  Follow any educational materials contained in this packet.  Continue miralax until you are having normal bowel movements. Use the Colace for 2 weeks after bowel movements normalize.  Follow-up instructions: Please follow-up with your primary care provider in the next week for further evaluation of your symptoms.   Return instructions:  Please return to the Emergency Department if you experience worsening symptoms.  Please return if you have worsening abdominal pain, swelling of your abdomen, persistent vomiting, blood in your stool or vomit, or fever.  Please return if you have any other emergent concerns.  Additional Information:  Your vital signs today were: BP 154/56 (BP Location: Left Arm)    Pulse (!) 52    Temp 98 F (36.7 C)    Resp 18    SpO2 100%  If your blood pressure (BP) was elevated above 135/85 this visit, please have this repeated by your doctor within one month. --------------

## 2016-02-10 NOTE — ED Triage Notes (Addendum)
Patient presents for abdominal pain, nausea and constipation x3 weeks. Seen at ED and PCP for same, no relief with multiple OTC treatments. Reports last BM today, small and hard. Denies dark stool.
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
## 2016-02-22 DIAGNOSIS — E860 Dehydration: Secondary | ICD-10-CM | POA: Diagnosis not present

## 2016-02-29 DIAGNOSIS — H33302 Unspecified retinal break, left eye: Secondary | ICD-10-CM | POA: Diagnosis not present

## 2016-02-29 DIAGNOSIS — H401122 Primary open-angle glaucoma, left eye, moderate stage: Secondary | ICD-10-CM | POA: Diagnosis not present

## 2016-02-29 DIAGNOSIS — H401111 Primary open-angle glaucoma, right eye, mild stage: Secondary | ICD-10-CM | POA: Diagnosis not present

## 2016-02-29 DIAGNOSIS — H52203 Unspecified astigmatism, bilateral: Secondary | ICD-10-CM | POA: Diagnosis not present

## 2016-03-07 DIAGNOSIS — Z86010 Personal history of colonic polyps: Secondary | ICD-10-CM | POA: Diagnosis not present

## 2016-03-07 DIAGNOSIS — R194 Change in bowel habit: Secondary | ICD-10-CM | POA: Diagnosis not present

## 2016-04-03 DIAGNOSIS — H6122 Impacted cerumen, left ear: Secondary | ICD-10-CM | POA: Diagnosis not present

## 2016-04-19 DIAGNOSIS — Z23 Encounter for immunization: Secondary | ICD-10-CM | POA: Diagnosis not present

## 2016-06-06 ENCOUNTER — Ambulatory Visit: Payer: Medicare Other | Admitting: Cardiology

## 2016-06-09 ENCOUNTER — Ambulatory Visit (INDEPENDENT_AMBULATORY_CARE_PROVIDER_SITE_OTHER): Payer: Medicare Other | Admitting: Cardiology

## 2016-06-09 ENCOUNTER — Encounter (INDEPENDENT_AMBULATORY_CARE_PROVIDER_SITE_OTHER): Payer: Self-pay

## 2016-06-09 ENCOUNTER — Ambulatory Visit: Payer: Medicare Other | Admitting: Cardiology

## 2016-06-09 ENCOUNTER — Encounter: Payer: Self-pay | Admitting: Cardiology

## 2016-06-09 VITALS — BP 130/70 | HR 90 | Ht 68.0 in | Wt 198.0 lb

## 2016-06-09 DIAGNOSIS — I2510 Atherosclerotic heart disease of native coronary artery without angina pectoris: Secondary | ICD-10-CM

## 2016-06-09 DIAGNOSIS — E65 Localized adiposity: Secondary | ICD-10-CM | POA: Diagnosis not present

## 2016-06-09 DIAGNOSIS — I2583 Coronary atherosclerosis due to lipid rich plaque: Secondary | ICD-10-CM

## 2016-06-09 DIAGNOSIS — E7800 Pure hypercholesterolemia, unspecified: Secondary | ICD-10-CM | POA: Diagnosis not present

## 2016-06-09 DIAGNOSIS — I10 Essential (primary) hypertension: Secondary | ICD-10-CM | POA: Diagnosis not present

## 2016-06-09 NOTE — Progress Notes (Signed)
[facility]. [address], [address], [place]  [address] Phone: [phone] Fax:  [phone]  Date:  06/09/2016   ID:  [name], DOB [birth year], MRN [id number]  PCP:  Vena Austria, [name], prior history of smoking, hypertension, hyperlipidemia here for followup.  No bleeding, no syncope.   Previously with increased shortness of breath, I checked a nuclear stress test 11/2015 and this was reassuring, low risk. I also had increased his Lasix however his creatinine once again went up. Decided to change it to when necessary.  Main complaint happens to be posterior neck pain, low back and hip pain. This does not enable him to exercise as much his usual.   Wt Readings from Last 3 Encounters:  06/09/16 198 lb (89.8 kg)  12/03/15 199 lb 12.8 oz (90.6 kg)  11/02/15 199 lb 6.4 oz (90.4 kg)     Past Medical History:  Diagnosis Date  . CAD (coronary artery disease)   . Hypercholesteremia   . Hyperlipidemia   . Hypertension   . Spleen injury     Past Surgical History:  Procedure Laterality Date  . CHOLECYSTECTOMY    . CORONARY STENT PLACEMENT    . THYROID SURGERY     growth removed    Current Outpatient Prescriptions  Medication Sig Dispense Refill  . aspirin EC 81 MG tablet Take 81 mg by mouth at bedtime.    . bisacodyl (DULCOLAX) 5 MG EC tablet Take 5 mg by mouth daily as needed for moderate constipation.    . Cholecalciferol (VITAMIN D-3) 1000 UNITS CAPS Take 2,000 Units by mouth daily.    . Coenzyme Q10 (CO Q 10) 10 MG CAPS Take 10 mg by mouth daily.    . fexofenadine (ALLEGRA) 180 MG tablet Take 180 mg by mouth daily as needed for allergies.   0  . fish oil-omega-3 fatty acids 1000 MG capsule Take 1 g by mouth daily.    . furosemide (LASIX) 40 MG tablet Take 1 tablet (40 mg total) by mouth daily as needed.  (Patient taking differently: Take 40 mg by mouth daily as needed for fluid or edema. ) 30 tablet 5  . glycerin adult (GLYCERIN ADULT) 2 g SUPP Place 1 suppository rectally daily as needed for moderate constipation or severe constipation. 10 suppository 0  . latanoprost (XALATAN) 0.005 % ophthalmic solution Place 1 drop into both eyes daily.  5  . magnesium hydroxide (MILK OF MAGNESIA) 400 MG/5ML suspension Take 30 mLs by mouth daily as needed for mild constipation.    . metoprolol tartrate (LOPRESSOR) 25 MG tablet Take 25 mg by mouth 2 (two) times daily.  1  . polyethylene glycol (MIRALAX / GLYCOLAX) packet Take 17 g by mouth daily as needed for mild constipation or moderate constipation.    . rosuvastatin (CRESTOR) 20 MG tablet Take 10 mg by mouth at bedtime.    . senna (SENOKOT) 8.6 MG TABS tablet Take 1 tablet by mouth daily as needed for mild constipation.    . valsartan-hydrochlorothiazide (DIOVAN-HCT) 320-25 MG per tablet Take 1 tablet by mouth daily.      No current facility-administered medications for this visit.     Allergies:    Allergies  Allergen Reactions  . Morphine And Related     'made him go crazy, made his insides hurt'  .  Avelox [Moxifloxacin Hcl In Nacl] Other (See Comments)    Dizziness    Social History:  The patient  reports that he has never smoked. He has never used smokeless tobacco. He reports that he does not drink alcohol or use drugs.   ROS:  Please see the history of present illness.     PHYSICAL EXAM: VS:  BP 130/70   Pulse 90   Ht 5\' 8"  (1.727 m)   Wt 198 lb (89.8 kg)   BMI 30.11 kg/m  Well nourished, well developed, in no acute distress  HEENT: normal  Neck: no JVD  Cardiac:  normal S1, S2; RRR; no murmur  Lungs:  clear to auscultation bilaterally, no wheezing, rhonchi or rales  Abd: soft, nontender, no hepatomegaly Distended abdomen Ext: no ankle edema compression hose Skin: warm and dry  Neuro: no focal abnormalities noted  EKG:  EKG was  ordered today 09/13/15-sinus rhythm, 53, no other abnormalities.04/30/14-sinus bradycardia no change.  Labs: 2014-LDL 62    NUC: 11/12/15: Normal ejection fraction, no significant ischemia identified. Reassuring, low risk.  ASSESSMENT AND PLAN:  CAD  - Reassuring nuclear stress test as above.  - Shortness of breath,  Lasix PRN. Remember, he is also on HCTZ.  - Creatinine was 1.5, BUN 35, mild dizziness after being out in the sunshine. He has not seen much increase in urine output still.  - Change Lasix to 40 mg when necessary, leg swelling. I do not want him to take this on a daily basis. His kidney function is becoming affected.    Hyperlipidemia -continue with Crestor 10 mg. Dr. Alyson Ingles has been checking.  Hypertension -currently well controlled. Diovan HCT.  Obesity -continue to encourage weight loss.   12 month f/u   Signed, Candee Furbish, MD [facility]  06/09/2016 9:40 AM

## 2016-06-09 NOTE — Patient Instructions (Signed)

## 2016-07-20 DIAGNOSIS — R0982 Postnasal drip: Secondary | ICD-10-CM | POA: Diagnosis not present

## 2016-07-20 DIAGNOSIS — J019 Acute sinusitis, unspecified: Secondary | ICD-10-CM | POA: Diagnosis not present

## 2016-08-15 DIAGNOSIS — L570 Actinic keratosis: Secondary | ICD-10-CM | POA: Diagnosis not present

## 2016-08-15 DIAGNOSIS — D225 Melanocytic nevi of trunk: Secondary | ICD-10-CM | POA: Diagnosis not present

## 2016-08-15 DIAGNOSIS — C44612 Basal cell carcinoma of skin of right upper limb, including shoulder: Secondary | ICD-10-CM | POA: Diagnosis not present

## 2016-08-15 DIAGNOSIS — C44519 Basal cell carcinoma of skin of other part of trunk: Secondary | ICD-10-CM | POA: Diagnosis not present

## 2016-08-15 DIAGNOSIS — X32XXXD Exposure to sunlight, subsequent encounter: Secondary | ICD-10-CM | POA: Diagnosis not present

## 2016-08-21 DIAGNOSIS — J209 Acute bronchitis, unspecified: Secondary | ICD-10-CM | POA: Diagnosis not present

## 2016-08-21 DIAGNOSIS — J329 Chronic sinusitis, unspecified: Secondary | ICD-10-CM | POA: Diagnosis not present

## 2016-08-29 DIAGNOSIS — H401131 Primary open-angle glaucoma, bilateral, mild stage: Secondary | ICD-10-CM | POA: Diagnosis not present

## 2016-09-13 DIAGNOSIS — Z85828 Personal history of other malignant neoplasm of skin: Secondary | ICD-10-CM | POA: Diagnosis not present

## 2016-09-13 DIAGNOSIS — Z08 Encounter for follow-up examination after completed treatment for malignant neoplasm: Secondary | ICD-10-CM | POA: Diagnosis not present

## 2016-09-27 DIAGNOSIS — E7800 Pure hypercholesterolemia, unspecified: Secondary | ICD-10-CM | POA: Diagnosis not present

## 2016-09-27 DIAGNOSIS — M542 Cervicalgia: Secondary | ICD-10-CM | POA: Diagnosis not present

## 2016-09-27 DIAGNOSIS — K219 Gastro-esophageal reflux disease without esophagitis: Secondary | ICD-10-CM | POA: Diagnosis not present

## 2016-09-27 DIAGNOSIS — I129 Hypertensive chronic kidney disease with stage 1 through stage 4 chronic kidney disease, or unspecified chronic kidney disease: Secondary | ICD-10-CM | POA: Diagnosis not present

## 2016-09-27 DIAGNOSIS — K5900 Constipation, unspecified: Secondary | ICD-10-CM | POA: Diagnosis not present

## 2016-09-27 DIAGNOSIS — R609 Edema, unspecified: Secondary | ICD-10-CM | POA: Diagnosis not present

## 2016-09-27 DIAGNOSIS — Z0000 Encounter for general adult medical examination without abnormal findings: Secondary | ICD-10-CM | POA: Diagnosis not present

## 2016-09-27 DIAGNOSIS — N183 Chronic kidney disease, stage 3 (moderate): Secondary | ICD-10-CM | POA: Diagnosis not present

## 2016-09-27 DIAGNOSIS — I2510 Atherosclerotic heart disease of native coronary artery without angina pectoris: Secondary | ICD-10-CM | POA: Diagnosis not present

## 2016-09-27 DIAGNOSIS — R141 Gas pain: Secondary | ICD-10-CM | POA: Diagnosis not present

## 2016-09-27 DIAGNOSIS — Z1389 Encounter for screening for other disorder: Secondary | ICD-10-CM | POA: Diagnosis not present

## 2016-10-03 DIAGNOSIS — H6122 Impacted cerumen, left ear: Secondary | ICD-10-CM | POA: Diagnosis not present

## 2016-10-03 DIAGNOSIS — J342 Deviated nasal septum: Secondary | ICD-10-CM | POA: Diagnosis not present

## 2016-10-03 DIAGNOSIS — J301 Allergic rhinitis due to pollen: Secondary | ICD-10-CM | POA: Diagnosis not present

## 2016-10-23 DIAGNOSIS — M542 Cervicalgia: Secondary | ICD-10-CM | POA: Diagnosis not present

## 2016-10-23 DIAGNOSIS — M545 Low back pain: Secondary | ICD-10-CM | POA: Diagnosis not present

## 2016-10-23 DIAGNOSIS — M25552 Pain in left hip: Secondary | ICD-10-CM | POA: Diagnosis not present

## 2016-11-08 DIAGNOSIS — M25552 Pain in left hip: Secondary | ICD-10-CM | POA: Diagnosis not present

## 2016-11-23 DIAGNOSIS — M542 Cervicalgia: Secondary | ICD-10-CM | POA: Diagnosis not present

## 2016-11-23 DIAGNOSIS — M47812 Spondylosis without myelopathy or radiculopathy, cervical region: Secondary | ICD-10-CM | POA: Diagnosis not present

## 2017-01-11 DIAGNOSIS — R1011 Right upper quadrant pain: Secondary | ICD-10-CM | POA: Diagnosis not present

## 2017-01-15 ENCOUNTER — Emergency Department (HOSPITAL_COMMUNITY): Payer: Medicare Other

## 2017-01-15 ENCOUNTER — Emergency Department (HOSPITAL_COMMUNITY)
Admission: EM | Admit: 2017-01-15 | Discharge: 2017-01-16 | Disposition: A | Discharge status: Home / Self Care | Payer: Medicare Other | Attending: Emergency Medicine | Admitting: Emergency Medicine

## 2017-01-15 DIAGNOSIS — I10 Essential (primary) hypertension: Secondary | ICD-10-CM | POA: Diagnosis not present

## 2017-01-15 DIAGNOSIS — R1084 Generalized abdominal pain: Secondary | ICD-10-CM | POA: Diagnosis not present

## 2017-01-15 DIAGNOSIS — K5900 Constipation, unspecified: Secondary | ICD-10-CM | POA: Diagnosis not present

## 2017-01-15 DIAGNOSIS — Z79899 Other long term (current) drug therapy: Secondary | ICD-10-CM | POA: Diagnosis not present

## 2017-01-15 DIAGNOSIS — K5641 Fecal impaction: Secondary | ICD-10-CM | POA: Diagnosis not present

## 2017-01-15 DIAGNOSIS — I2510 Atherosclerotic heart disease of native coronary artery without angina pectoris: Secondary | ICD-10-CM | POA: Diagnosis not present

## 2017-01-15 DIAGNOSIS — Z7982 Long term (current) use of aspirin: Secondary | ICD-10-CM | POA: Insufficient documentation

## 2017-01-15 DIAGNOSIS — K5909 Other constipation: Secondary | ICD-10-CM

## 2017-01-15 DIAGNOSIS — Z9049 Acquired absence of other specified parts of digestive tract: Secondary | ICD-10-CM | POA: Insufficient documentation

## 2017-01-15 DIAGNOSIS — R109 Unspecified abdominal pain: Secondary | ICD-10-CM | POA: Diagnosis present

## 2017-01-15 LAB — COMPREHENSIVE METABOLIC PANEL
ALBUMIN: 3.8 g/dL (ref 3.5–5.0)
ALK PHOS: 57 U/L (ref 38–126)
ALT: 15 U/L — ABNORMAL LOW (ref 17–63)
AST: 16 U/L (ref 15–41)
Anion gap: 7 (ref 5–15)
BILIRUBIN TOTAL: 0.5 mg/dL (ref 0.3–1.2)
BUN: 36 mg/dL — ABNORMAL HIGH (ref 6–20)
CO2: 26 mmol/L (ref 22–32)
Calcium: 8.7 mg/dL — ABNORMAL LOW (ref 8.9–10.3)
Chloride: 104 mmol/L (ref 101–111)
Creatinine, Ser: 1.41 mg/dL — ABNORMAL HIGH (ref 0.61–1.24)
GFR calc Af Amer: 51 mL/min — ABNORMAL LOW (ref >60)
GFR calc non Af Amer: 44 mL/min — ABNORMAL LOW (ref >60)
GLUCOSE: 99 mg/dL (ref 65–99)
POTASSIUM: 4.4 mmol/L (ref 3.5–5.1)
SODIUM: 137 mmol/L (ref 135–145)
TOTAL PROTEIN: 6.2 g/dL — AB (ref 6.5–8.1)

## 2017-01-15 LAB — URINALYSIS, ROUTINE W REFLEX MICROSCOPIC
BILIRUBIN URINE: NEGATIVE
GLUCOSE, UA: NEGATIVE mg/dL
HGB URINE DIPSTICK: NEGATIVE
KETONES UR: NEGATIVE mg/dL
Leukocytes, UA: NEGATIVE
NITRITE: NEGATIVE
PH: 5 (ref 5.0–8.0)
Protein, ur: NEGATIVE mg/dL
SPECIFIC GRAVITY, URINE: 1.016 (ref 1.005–1.030)

## 2017-01-15 LAB — CBC
HEMATOCRIT: 36.6 % — AB (ref 39.0–52.0)
Hemoglobin: 12 g/dL — ABNORMAL LOW (ref 13.0–17.0)
MCH: 30.3 pg (ref 26.0–34.0)
MCHC: 32.8 g/dL (ref 30.0–36.0)
MCV: 92.4 fL (ref 78.0–100.0)
Platelets: 159 10*3/uL (ref 150–400)
RBC: 3.96 MIL/uL — ABNORMAL LOW (ref 4.22–5.81)
RDW: 12.4 % (ref 11.5–15.5)
WBC: 6.4 10*3/uL (ref 4.0–10.5)

## 2017-01-15 LAB — LIPASE, BLOOD: Lipase: 46 U/L (ref 11–51)

## 2017-01-15 MED ORDER — IOPAMIDOL (ISOVUE-300) INJECTION 61%
INTRAVENOUS | Status: AC
  Administered 2017-01-15: 100 mL
  Filled 2017-01-15: qty 100

## 2017-01-15 NOTE — ED Triage Notes (Signed)
Pt endorses abd discomfort x 2 weeks and has hx of chronic GI problems. [facility] and was [facility] on priolosec and was told that "i might have IBS" Pt was told that he did not have a blockage. Pt was called by his Dr this morning and was told to come here. VSS
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
## 2017-01-16 NOTE — Discharge Instructions (Signed)
Return here as needed.  Your CT scan did not show any significant abnormality.  Follow-up with your primary doctor, increase your fluid intake, rest as much as possible

## 2017-02-01 DIAGNOSIS — K5901 Slow transit constipation: Secondary | ICD-10-CM | POA: Diagnosis not present

## 2017-02-01 DIAGNOSIS — R768 Other specified abnormal immunological findings in serum: Secondary | ICD-10-CM | POA: Diagnosis not present

## 2017-02-26 DIAGNOSIS — R141 Gas pain: Secondary | ICD-10-CM | POA: Diagnosis not present

## 2017-02-26 DIAGNOSIS — K5901 Slow transit constipation: Secondary | ICD-10-CM | POA: Diagnosis not present

## 2017-02-26 DIAGNOSIS — R768 Other specified abnormal immunological findings in serum: Secondary | ICD-10-CM | POA: Diagnosis not present

## 2017-03-06 DIAGNOSIS — H401131 Primary open-angle glaucoma, bilateral, mild stage: Secondary | ICD-10-CM | POA: Diagnosis not present

## 2017-03-06 DIAGNOSIS — H43813 Vitreous degeneration, bilateral: Secondary | ICD-10-CM | POA: Diagnosis not present

## 2017-03-06 DIAGNOSIS — H1859 Other hereditary corneal dystrophies: Secondary | ICD-10-CM | POA: Diagnosis not present

## 2017-03-06 DIAGNOSIS — H52203 Unspecified astigmatism, bilateral: Secondary | ICD-10-CM | POA: Diagnosis not present

## 2017-04-17 DIAGNOSIS — Z23 Encounter for immunization: Secondary | ICD-10-CM | POA: Diagnosis not present

## 2017-05-01 DIAGNOSIS — E7800 Pure hypercholesterolemia, unspecified: Secondary | ICD-10-CM | POA: Diagnosis not present

## 2017-05-01 DIAGNOSIS — K219 Gastro-esophageal reflux disease without esophagitis: Secondary | ICD-10-CM | POA: Diagnosis not present

## 2017-05-01 DIAGNOSIS — K5900 Constipation, unspecified: Secondary | ICD-10-CM | POA: Diagnosis not present

## 2017-05-01 DIAGNOSIS — R609 Edema, unspecified: Secondary | ICD-10-CM | POA: Diagnosis not present

## 2017-05-01 DIAGNOSIS — Z6830 Body mass index (BMI) 30.0-30.9, adult: Secondary | ICD-10-CM | POA: Diagnosis not present

## 2017-05-01 DIAGNOSIS — I2510 Atherosclerotic heart disease of native coronary artery without angina pectoris: Secondary | ICD-10-CM | POA: Diagnosis not present

## 2017-05-01 DIAGNOSIS — N183 Chronic kidney disease, stage 3 (moderate): Secondary | ICD-10-CM | POA: Diagnosis not present

## 2017-05-01 DIAGNOSIS — M542 Cervicalgia: Secondary | ICD-10-CM | POA: Diagnosis not present

## 2017-05-01 DIAGNOSIS — I129 Hypertensive chronic kidney disease with stage 1 through stage 4 chronic kidney disease, or unspecified chronic kidney disease: Secondary | ICD-10-CM | POA: Diagnosis not present

## 2017-05-01 DIAGNOSIS — R141 Gas pain: Secondary | ICD-10-CM | POA: Diagnosis not present

## 2017-06-04 DIAGNOSIS — N183 Chronic kidney disease, stage 3 (moderate): Secondary | ICD-10-CM | POA: Diagnosis not present

## 2017-06-18 ENCOUNTER — Ambulatory Visit: Payer: Medicare Other | Admitting: Cardiology

## 2017-08-07 ENCOUNTER — Ambulatory Visit: Payer: Medicare Other | Admitting: Cardiology

## 2017-08-31 ENCOUNTER — Ambulatory Visit (INDEPENDENT_AMBULATORY_CARE_PROVIDER_SITE_OTHER): Payer: Medicare Other | Admitting: Cardiology

## 2017-08-31 ENCOUNTER — Encounter: Payer: Self-pay | Admitting: Cardiology

## 2017-08-31 VITALS — BP 130/76 | HR 56

## 2017-08-31 DIAGNOSIS — I10 Essential (primary) hypertension: Secondary | ICD-10-CM

## 2017-08-31 DIAGNOSIS — E7800 Pure hypercholesterolemia, unspecified: Secondary | ICD-10-CM | POA: Diagnosis not present

## 2017-08-31 DIAGNOSIS — E65 Localized adiposity: Secondary | ICD-10-CM | POA: Diagnosis not present

## 2017-08-31 DIAGNOSIS — I2510 Atherosclerotic heart disease of native coronary artery without angina pectoris: Secondary | ICD-10-CM

## 2017-08-31 NOTE — Progress Notes (Signed)
[facility]. [address], [place]  [address] Phone: [phone] Fax:  [phone]  Date:  08/31/2017   ID:  GABREIL YONKERS, DOB 1929/04/22, MRN [id number]  PCP:  Maury Dus, MD   History of Present Illness: RONI SCOW is a 84 y.o. male with coronary artery disease status post percutaneous intervention in 2001, prior history of smoking, hypertension, hyperlipidemia here for followup.  No bleeding, no syncope.   Previously with increased shortness of breath, I checked a nuclear stress test 11/2015 and this was reassuring, low risk. I also had increased his Lasix however his creatinine once again went up. Decided to change it to when necessary.  08/31/17-overall doing quite well.  He did go to the emergency room in July 2018 with significant abdominal pain.  He was taking MiraLAX 3 times a day and then decided to stop it and his pain has subsided.  He has no chest discomfort no shortness of breath no syncope no bleeding.  Tolerating medications well.  Wt Readings from Last 3 Encounters:  06/09/16 198 lb (89.8 kg)  12/03/15 199 lb 12.8 oz (90.6 kg)  11/02/15 199 lb 6.4 oz (90.4 kg)     Past Medical History:  Diagnosis Date  . CAD (coronary artery disease)   . Hypercholesteremia   . Hyperlipidemia   . Hypertension   . Spleen injury     Past Surgical History:  Procedure Laterality Date  . CHOLECYSTECTOMY    . CORONARY STENT PLACEMENT    . THYROID SURGERY     growth removed    Current Outpatient Medications  Medication Sig Dispense Refill  . aspirin EC 81 MG tablet Take 81 mg by mouth at bedtime.    . Cholecalciferol (VITAMIN D-3) 1000 UNITS CAPS Take 2,000 Units by mouth daily.    . Coenzyme Q10 (CO Q 10) 10 MG CAPS Take 10 mg by mouth daily.    . fish oil-omega-3 fatty acids 1000 MG capsule Take 1 g by mouth daily.    . furosemide (LASIX) 40 MG tablet Take 1 tablet (40 mg total) by mouth daily as needed. (Patient taking differently: Take 40 mg  by mouth daily as needed for fluid or edema. ) 30 tablet 5  . glycerin adult (GLYCERIN ADULT) 2 g SUPP Place 1 suppository rectally daily as needed for moderate constipation or severe constipation. 10 suppository 0  . latanoprost (XALATAN) 0.005 % ophthalmic solution Place 1 drop into both eyes at bedtime.   5  . metoprolol tartrate (LOPRESSOR) 25 MG tablet Take 25 mg by mouth 2 (two) times daily.  1  . rosuvastatin (CRESTOR) 20 MG tablet Take 20 mg by mouth at bedtime.     . senna (SENOKOT) 8.6 MG TABS tablet Take 1 tablet by mouth daily as needed for mild constipation.    . valsartan-hydrochlorothiazide (DIOVAN-HCT) 320-25 MG per tablet Take 1 tablet by mouth daily.      No current facility-administered medications for this visit.     Allergies:    Allergies  Allergen Reactions  . Morphine And Related Other (See Comments)    "Made him go crazy, made his insides hurt"  . Avelox [Moxifloxacin Hcl In Nacl] Other (See Comments)    Dizziness    Social History:  The patient  reports that  has never smoked. he has never used smokeless tobacco. He reports that he does not drink alcohol or use drugs.   ROS:  Please see  the history of present illness.     PHYSICAL EXAM: VS:  BP 130/76   Pulse (!) 56  GEN: Well nourished, well developed, in no acute distress  HEENT: normal  Neck: no JVD, carotid bruits, or masses Cardiac: RRR; no murmurs, rubs, or gallops,no edema  Respiratory:  clear to auscultation bilaterally, normal work of breathing GI: soft, nontender, distended, + BS MS: no deformity or atrophy  Skin: warm and dry, no rash Neuro:  Alert and Oriented x 3, Strength and sensation are intact Psych: euthymic mood, full affect  EKG:  EKG was ordered today 08/31/17-sinus bradycardia 56 with no other obvious abnormalities.  Personally viewed-prior 09/13/15-sinus rhythm, 53, no other abnormalities.04/30/14-sinus bradycardia no change.  Labs:LDL 38  NUC: 11/12/15: Normal ejection fraction,  no significant ischemia identified. Reassuring, low risk.  ASSESSMENT AND PLAN:  CAD  - Reassuring nuclear stress test as above.  - Shortness of breath,  Lasix PRN.   Hyperlipidemia -continue with Crestor 10 mg. Dr. Alyson Ingles has been checking.  Excellent.  Hypertension -currently well controlled. Diovan HCT.  No changes.  Doing very well.  Obesity -Weight loss discussed once again.  Exercise.  12 month f/u   Signed, Candee Furbish, MD [facility]  08/31/2017 3:13 PM

## 2017-08-31 NOTE — Patient Instructions (Signed)

## 2017-09-03 NOTE — Addendum Note (Signed)
Addended by: Shellia Cleverly on: 09/03/2017 02:21 PM   Modules accepted: Orders

## 2017-09-10 DIAGNOSIS — H401131 Primary open-angle glaucoma, bilateral, mild stage: Secondary | ICD-10-CM | POA: Diagnosis not present

## 2017-09-10 DIAGNOSIS — H04123 Dry eye syndrome of bilateral lacrimal glands: Secondary | ICD-10-CM | POA: Diagnosis not present

## 2017-09-10 DIAGNOSIS — H1859 Other hereditary corneal dystrophies: Secondary | ICD-10-CM | POA: Diagnosis not present

## 2017-09-27 ENCOUNTER — Encounter (HOSPITAL_COMMUNITY): Payer: Self-pay | Admitting: *Deleted

## 2017-09-27 ENCOUNTER — Emergency Department (HOSPITAL_COMMUNITY)
Admission: EM | Admit: 2017-09-27 | Discharge: 2017-09-28 | Disposition: A | Discharge status: Home / Self Care | Payer: Medicare Other | Attending: Emergency Medicine | Admitting: Emergency Medicine

## 2017-09-27 ENCOUNTER — Emergency Department (HOSPITAL_COMMUNITY): Payer: Medicare Other

## 2017-09-27 ENCOUNTER — Other Ambulatory Visit: Payer: Self-pay

## 2017-09-27 DIAGNOSIS — I2510 Atherosclerotic heart disease of native coronary artery without angina pectoris: Secondary | ICD-10-CM | POA: Insufficient documentation

## 2017-09-27 DIAGNOSIS — K5730 Diverticulosis of large intestine without perforation or abscess without bleeding: Secondary | ICD-10-CM | POA: Diagnosis not present

## 2017-09-27 DIAGNOSIS — I119 Hypertensive heart disease without heart failure: Secondary | ICD-10-CM | POA: Insufficient documentation

## 2017-09-27 DIAGNOSIS — K5909 Other constipation: Secondary | ICD-10-CM | POA: Diagnosis not present

## 2017-09-27 DIAGNOSIS — R1084 Generalized abdominal pain: Secondary | ICD-10-CM | POA: Diagnosis present

## 2017-09-27 DIAGNOSIS — Z7982 Long term (current) use of aspirin: Secondary | ICD-10-CM | POA: Diagnosis not present

## 2017-09-27 DIAGNOSIS — Z79899 Other long term (current) drug therapy: Secondary | ICD-10-CM | POA: Insufficient documentation

## 2017-09-27 DIAGNOSIS — K4090 Unilateral inguinal hernia, without obstruction or gangrene, not specified as recurrent: Secondary | ICD-10-CM | POA: Diagnosis not present

## 2017-09-27 LAB — COMPREHENSIVE METABOLIC PANEL
ALT: 17 U/L (ref 17–63)
ANION GAP: 10 (ref 5–15)
AST: 20 U/L (ref 15–41)
Albumin: 3.9 g/dL (ref 3.5–5.0)
Alkaline Phosphatase: 60 U/L (ref 38–126)
BUN: 30 mg/dL — ABNORMAL HIGH (ref 6–20)
CHLORIDE: 103 mmol/L (ref 101–111)
CO2: 26 mmol/L (ref 22–32)
Calcium: 9.1 mg/dL (ref 8.9–10.3)
Creatinine, Ser: 1.41 mg/dL — ABNORMAL HIGH (ref 0.61–1.24)
GFR calc non Af Amer: 44 mL/min — ABNORMAL LOW (ref >60)
GFR, EST AFRICAN AMERICAN: 50 mL/min — AB (ref >60)
Glucose, Bld: 100 mg/dL — ABNORMAL HIGH (ref 65–99)
Potassium: 3.8 mmol/L (ref 3.5–5.1)
Sodium: 139 mmol/L (ref 135–145)
Total Bilirubin: 0.7 mg/dL (ref 0.3–1.2)
Total Protein: 6.7 g/dL (ref 6.5–8.1)

## 2017-09-27 LAB — CBC
HEMATOCRIT: 38.9 % — AB (ref 39.0–52.0)
HEMOGLOBIN: 12.8 g/dL — AB (ref 13.0–17.0)
MCH: 30.3 pg (ref 26.0–34.0)
MCHC: 32.9 g/dL (ref 30.0–36.0)
MCV: 92 fL (ref 78.0–100.0)
Platelets: 184 10*3/uL (ref 150–400)
RBC: 4.23 MIL/uL (ref 4.22–5.81)
RDW: 12.6 % (ref 11.5–15.5)
WBC: 7.5 10*3/uL (ref 4.0–10.5)

## 2017-09-27 LAB — LIPASE, BLOOD: LIPASE: 75 U/L — AB (ref 11–51)

## 2017-09-27 MED ORDER — IOPAMIDOL (ISOVUE-300) INJECTION 61%
100.0000 mL | Freq: Once | INTRAVENOUS | Status: AC | PRN
  Administered 2017-09-27: 100 mL via INTRAVENOUS

## 2017-09-27 NOTE — ED Provider Notes (Signed)
Patient placed in Quick Look pathway, seen and evaluated   Chief Complaint: Generalized abdominal pain and distention  HPI:   Patient presenting with abdominal discomfort, distention and no bowel movements for the last 3 days.  He also reports pressure in his lower back.  No urinary symptoms.  Patient is being followed by gastroenterology and was currently on MiraLAX but stopped taking any medications approximately 5 days ago.  He reports that he is eating as usual but not having any bowel movements and when he does they are watery.  ROS: Denies nausea, vomiting, fever, chills  Physical Exam:   Gen: No distress  Neuro: Awake and Alert  Skin: Warm    Focused Exam: Abdomen is distended and with general discomfort on palpation, no focal tenderness palpation.  Patient is otherwise well-appearing, nontoxic and afebrile.   Initiation of care has begun. The patient has been counseled on the process, plan, and necessity for staying for the completion/evaluation, and the remainder of the medical screening examination    Dossie Der 09/27/17 1825    Margette Fast, MD 09/27/17 2001

## 2017-09-27 NOTE — ED Triage Notes (Signed)
To ED for eval of abd pain. States he is afraid he has a blockage. States this started in July. Has been evaluated by GI and placed on Miralax 3 times a day- result was watery stool. Called GI after 2 months of this an told he 'hadn't been using it long enough'. Then seen by GI PA and miralax cut back to 2 times a day with metamucil once a day. Stopped using miralax and metamucil approx 5 days ago and now not able to have BM. Intermittent nausea but no vomiting.

## 2017-09-28 MED ORDER — ONDANSETRON 4 MG PO TBDP: 4.0000 mg | ORAL_TABLET | Freq: Four times a day (QID) | ORAL | 0 refills | Status: DC | PRN

## 2017-09-28 MED ORDER — PROBIOTIC PO CAPS: 1.0000 | ORAL_CAPSULE | Freq: Every day | ORAL | 1 refills | Status: DC

## 2017-09-28 NOTE — ED Notes (Signed)
ED Provider at bedside. 

## 2017-09-28 NOTE — Discharge Instructions (Signed)
I recommend that you increase your water and fiber intake. If you are not able to eat foods high in fiber, you may use Benefiber or Metamucil over-the-counter. I also recommend you use MiraLAX 1-2 times a day and Colace 100 mg twice a day to help with bowel movements. These medications are over the counter.  You may use other over-the-counter medications such as Dulcolax, Fleet enemas, magnesium citrate as needed for constipation. Please note that some of these medications may cause you to have abdominal cramping which is normal. If you develop severe abdominal pain, fever, vomiting, distention of your abdomen, unable to have a bowel movement for 5 days or are not passing gas, please return to the hospital.

## 2017-09-28 NOTE — ED Provider Notes (Signed)
TIME SEEN: 12:02 AM  CHIEF COMPLAINT: Chronic constipation  HPI: Patient is an 84 year old male with history of hypertension, hyperlipidemia, H. pylori, ulcers, gastritis, CAD status post stents who presents to the emergency department with complaints of chronic constipation that has been ongoing for several months.  States that he was seen by his gastroenterologist Dr. Amedeo Plenty [facility] in August and was placed on MiraLAX 3-4 times a day and Metamucil once a day.  States that he started having very liquid stools but was never able to pass a solid bowel movement.  Has continued MiraLAX once or twice a day with Metamucil once or twice a day until a week ago and still has had difficulty with bowel movements.  He is passing gas.  He had some nausea and feeling of fullness but no fevers.  No vomiting.  Reports he has been dealing with constipation intermittently for the past 4 years.  States he was concerned tonight that he could have a "obstruction".  No history of bowel obstruction.  No history of abdominal surgery.  No bloody stool or melena.  ROS: See HPI Constitutional: no fever  Eyes: no drainage  ENT: no runny nose   Cardiovascular:  no chest pain  Resp: no SOB  GI: no vomiting GU: no dysuria Integumentary: no rash  Allergy: no hives  Musculoskeletal: no leg swelling  Neurological: no slurred speech ROS otherwise negative  PAST MEDICAL HISTORY/PAST SURGICAL HISTORY:  Past Medical History:  Diagnosis Date  . CAD (coronary artery disease)   . Hypercholesteremia   . Hyperlipidemia   . Hypertension   . Spleen injury     MEDICATIONS:  Prior to Admission medications   Medication Sig Start Date End Date Taking? Authorizing Provider  aspirin EC 81 MG tablet Take 81 mg by mouth at bedtime.    [provider]  Cholecalciferol (VITAMIN D-3) 1000 UNITS CAPS Take 2,000 Units by mouth daily.    [provider]  Coenzyme Q10 (CO Q 10) 10 MG CAPS Take 10 mg by mouth daily.     [provider]  fish oil-omega-3 fatty acids 1000 MG capsule Take 1 g by mouth daily.    [provider]  furosemide (LASIX) 40 MG tablet Take 1 tablet (40 mg total) by mouth daily as needed. Patient taking differently: Take 40 mg by mouth daily as needed for fluid or edema.  12/03/15   Jerline Pain, MD  glycerin adult (GLYCERIN ADULT) 2 g SUPP Place 1 suppository rectally daily as needed for moderate constipation or severe constipation. 02/10/16   Carlisle Cater, PA-C  latanoprost (XALATAN) 0.005 % ophthalmic solution Place 1 drop into both eyes at bedtime.  10/12/15   [provider]  metoprolol tartrate (LOPRESSOR) 25 MG tablet Take 25 mg by mouth 2 (two) times daily. 07/22/15   [provider]  rosuvastatin (CRESTOR) 20 MG tablet Take 20 mg by mouth at bedtime.     [provider]  senna (SENOKOT) 8.6 MG TABS tablet Take 1 tablet by mouth daily as needed for mild constipation.    [provider]  valsartan-hydrochlorothiazide (DIOVAN-HCT) 320-25 MG per tablet Take 1 tablet by mouth daily.  08/06/13   [provider]    ALLERGIES:  Allergies  Allergen Reactions  . Morphine And Related Other (See Comments)    "Made him go crazy, made his insides hurt"  . Avelox [Moxifloxacin Hcl In Nacl] Other (See Comments)    Dizziness    SOCIAL HISTORY:  Social History   Tobacco Use  . Smoking status: Never Smoker  . Smokeless tobacco: Never Used  Substance Use Topics  . Alcohol use: No    FAMILY HISTORY: Family History  Problem Relation Age of Onset  . Heart disease Father   . Heart attack Father     EXAM: BP (!) 163/53 (BP Location: Right Arm)   Pulse (!) 50   Temp 99 F (37.2 C) (Oral)   Resp 14   SpO2 99%  CONSTITUTIONAL: Alert and oriented and responds appropriately to questions. Well-appearing; well-nourished HEAD: Normocephalic EYES: Conjunctivae clear, pupils appear equal, EOMI ENT: normal nose; moist mucous  membranes NECK: Supple, no meningismus, no nuchal rigidity, no LAD  CARD: RRR; S1 and S2 appreciated; no murmurs, no clicks, no rubs, no gallops RESP: Normal chest excursion without splinting or tachypnea; breath sounds clear and equal bilaterally; no wheezes, no rhonchi, no rales, no hypoxia or respiratory distress, speaking full sentences ABD/GI: Normal bowel sounds; non-distended; soft, non-tender, no rebound, no guarding, no peritoneal signs, no hepatosplenomegaly RECTAL:  Normal rectal tone, no gross blood or melena, very small amount of soft formed stool in the rectal vault, no hemorrhoids appreciated, nontender rectal exam, no fecal impaction BACK:  The back appears normal and is non-tender to palpation, there is no CVA tenderness EXT: Normal ROM in all joints; non-tender to palpation; no edema; normal capillary refill; no cyanosis, no calf tenderness or swelling    SKIN: Normal color for age and race; warm; no rash NEURO: Moves all extremities equally PSYCH: The patient's mood and manner are appropriate. Grooming and personal hygiene are appropriate.  MEDICAL DECISION MAKING: Patient here with chronic constipation.  His abdominal exam is benign.  His workup here is also unremarkable.  Mildly elevated lipase but nothing to suggest pancreatitis.  Normal LFTs.  No leukocytosis.  CT scan ordered in triage shows no acute abnormality.  There is no bowel obstruction or inflammation.  He has diverticulosis without diverticulitis.  He has no fecal impaction.  I recommended he continue MiraLAX, Metamucil and increase his water and fiber intake.  Recommended follow-up with his gastroenterologist.  Do not feel he needs further emergent workup at this time.  There is no sign of colitis, appendicitis, diverticulitis, bowel obstruction.  At this time, I do not feel there is any life-threatening condition present. I have reviewed and discussed all results (EKG, imaging, lab, urine as appropriate) and exam  findings with patient/family. I have reviewed nursing notes and appropriate previous records.  I feel the patient is safe to be discharged home without further emergent workup and can continue workup as an outpatient as needed. Discussed usual and customary return precautions. Patient/family verbalize understanding and are comfortable with this plan.  Outpatient follow-up has been provided if needed. All questions have been answered.      Patsy Zaragoza, Delice Bison, DO 09/28/17 0030

## 2017-10-08 DIAGNOSIS — K219 Gastro-esophageal reflux disease without esophagitis: Secondary | ICD-10-CM | POA: Diagnosis not present

## 2017-10-08 DIAGNOSIS — Z1389 Encounter for screening for other disorder: Secondary | ICD-10-CM | POA: Diagnosis not present

## 2017-10-08 DIAGNOSIS — N183 Chronic kidney disease, stage 3 (moderate): Secondary | ICD-10-CM | POA: Diagnosis not present

## 2017-10-08 DIAGNOSIS — E7800 Pure hypercholesterolemia, unspecified: Secondary | ICD-10-CM | POA: Diagnosis not present

## 2017-10-08 DIAGNOSIS — R609 Edema, unspecified: Secondary | ICD-10-CM | POA: Diagnosis not present

## 2017-10-08 DIAGNOSIS — Z0000 Encounter for general adult medical examination without abnormal findings: Secondary | ICD-10-CM | POA: Diagnosis not present

## 2017-10-08 DIAGNOSIS — I2510 Atherosclerotic heart disease of native coronary artery without angina pectoris: Secondary | ICD-10-CM | POA: Diagnosis not present

## 2017-10-08 DIAGNOSIS — M542 Cervicalgia: Secondary | ICD-10-CM | POA: Diagnosis not present

## 2017-10-08 DIAGNOSIS — R141 Gas pain: Secondary | ICD-10-CM | POA: Diagnosis not present

## 2017-10-08 DIAGNOSIS — K5900 Constipation, unspecified: Secondary | ICD-10-CM | POA: Diagnosis not present

## 2017-10-08 DIAGNOSIS — I129 Hypertensive chronic kidney disease with stage 1 through stage 4 chronic kidney disease, or unspecified chronic kidney disease: Secondary | ICD-10-CM | POA: Diagnosis not present

## 2017-10-08 DIAGNOSIS — Z6830 Body mass index (BMI) 30.0-30.9, adult: Secondary | ICD-10-CM | POA: Diagnosis not present

## 2017-11-08 DIAGNOSIS — K5900 Constipation, unspecified: Secondary | ICD-10-CM | POA: Diagnosis not present

## 2017-11-16 DIAGNOSIS — D649 Anemia, unspecified: Secondary | ICD-10-CM | POA: Diagnosis not present

## 2017-11-16 DIAGNOSIS — N183 Chronic kidney disease, stage 3 (moderate): Secondary | ICD-10-CM | POA: Diagnosis not present

## 2017-12-21 DIAGNOSIS — E7800 Pure hypercholesterolemia, unspecified: Secondary | ICD-10-CM | POA: Diagnosis not present

## 2018-02-05 DIAGNOSIS — H6122 Impacted cerumen, left ear: Secondary | ICD-10-CM | POA: Diagnosis not present

## 2018-02-19 DIAGNOSIS — Z85828 Personal history of other malignant neoplasm of skin: Secondary | ICD-10-CM | POA: Diagnosis not present

## 2018-02-19 DIAGNOSIS — Z08 Encounter for follow-up examination after completed treatment for malignant neoplasm: Secondary | ICD-10-CM | POA: Diagnosis not present

## 2018-02-19 DIAGNOSIS — L218 Other seborrheic dermatitis: Secondary | ICD-10-CM | POA: Diagnosis not present

## 2018-02-19 DIAGNOSIS — B078 Other viral warts: Secondary | ICD-10-CM | POA: Diagnosis not present

## 2018-02-19 DIAGNOSIS — D225 Melanocytic nevi of trunk: Secondary | ICD-10-CM | POA: Diagnosis not present

## 2018-03-13 DIAGNOSIS — H43813 Vitreous degeneration, bilateral: Secondary | ICD-10-CM | POA: Diagnosis not present

## 2018-03-13 DIAGNOSIS — H1859 Other hereditary corneal dystrophies: Secondary | ICD-10-CM | POA: Diagnosis not present

## 2018-03-13 DIAGNOSIS — H52203 Unspecified astigmatism, bilateral: Secondary | ICD-10-CM | POA: Diagnosis not present

## 2018-03-13 DIAGNOSIS — H401131 Primary open-angle glaucoma, bilateral, mild stage: Secondary | ICD-10-CM | POA: Diagnosis not present

## 2018-04-10 DIAGNOSIS — M542 Cervicalgia: Secondary | ICD-10-CM | POA: Diagnosis not present

## 2018-04-10 DIAGNOSIS — R609 Edema, unspecified: Secondary | ICD-10-CM | POA: Diagnosis not present

## 2018-04-10 DIAGNOSIS — N183 Chronic kidney disease, stage 3 (moderate): Secondary | ICD-10-CM | POA: Diagnosis not present

## 2018-04-10 DIAGNOSIS — R141 Gas pain: Secondary | ICD-10-CM | POA: Diagnosis not present

## 2018-04-10 DIAGNOSIS — I129 Hypertensive chronic kidney disease with stage 1 through stage 4 chronic kidney disease, or unspecified chronic kidney disease: Secondary | ICD-10-CM | POA: Diagnosis not present

## 2018-04-10 DIAGNOSIS — K219 Gastro-esophageal reflux disease without esophagitis: Secondary | ICD-10-CM | POA: Diagnosis not present

## 2018-04-10 DIAGNOSIS — I2510 Atherosclerotic heart disease of native coronary artery without angina pectoris: Secondary | ICD-10-CM | POA: Diagnosis not present

## 2018-04-10 DIAGNOSIS — Z23 Encounter for immunization: Secondary | ICD-10-CM | POA: Diagnosis not present

## 2018-04-10 DIAGNOSIS — E7800 Pure hypercholesterolemia, unspecified: Secondary | ICD-10-CM | POA: Diagnosis not present

## 2018-04-10 DIAGNOSIS — K5900 Constipation, unspecified: Secondary | ICD-10-CM | POA: Diagnosis not present

## 2018-07-08 ENCOUNTER — Emergency Department (HOSPITAL_COMMUNITY): Payer: Medicare Other

## 2018-07-08 ENCOUNTER — Emergency Department (HOSPITAL_COMMUNITY)
Admission: EM | Admit: 2018-07-08 | Discharge: 2018-07-08 | Disposition: A | Discharge status: Home / Self Care | Payer: Medicare Other | Attending: Emergency Medicine | Admitting: Emergency Medicine

## 2018-07-08 ENCOUNTER — Other Ambulatory Visit: Payer: Self-pay

## 2018-07-08 DIAGNOSIS — I2510 Atherosclerotic heart disease of native coronary artery without angina pectoris: Secondary | ICD-10-CM | POA: Diagnosis not present

## 2018-07-08 DIAGNOSIS — K5730 Diverticulosis of large intestine without perforation or abscess without bleeding: Secondary | ICD-10-CM | POA: Diagnosis not present

## 2018-07-08 DIAGNOSIS — R109 Unspecified abdominal pain: Secondary | ICD-10-CM

## 2018-07-08 DIAGNOSIS — Z7982 Long term (current) use of aspirin: Secondary | ICD-10-CM | POA: Insufficient documentation

## 2018-07-08 DIAGNOSIS — K5900 Constipation, unspecified: Secondary | ICD-10-CM

## 2018-07-08 DIAGNOSIS — Z79899 Other long term (current) drug therapy: Secondary | ICD-10-CM | POA: Insufficient documentation

## 2018-07-08 DIAGNOSIS — R1084 Generalized abdominal pain: Secondary | ICD-10-CM | POA: Diagnosis present

## 2018-07-08 DIAGNOSIS — I10 Essential (primary) hypertension: Secondary | ICD-10-CM | POA: Insufficient documentation

## 2018-07-08 LAB — CBC WITH DIFFERENTIAL/PLATELET
ABS IMMATURE GRANULOCYTES: 0.03 10*3/uL (ref 0.00–0.07)
Basophils Absolute: 0 10*3/uL (ref 0.0–0.1)
Basophils Relative: 0 %
Eosinophils Absolute: 0.2 10*3/uL (ref 0.0–0.5)
Eosinophils Relative: 2 %
HCT: 39.4 % (ref 39.0–52.0)
Hemoglobin: 12.6 g/dL — ABNORMAL LOW (ref 13.0–17.0)
IMMATURE GRANULOCYTES: 0 %
Lymphocytes Relative: 26 %
Lymphs Abs: 2.1 10*3/uL (ref 0.7–4.0)
MCH: 29.8 pg (ref 26.0–34.0)
MCHC: 32 g/dL (ref 30.0–36.0)
MCV: 93.1 fL (ref 80.0–100.0)
MONO ABS: 0.7 10*3/uL (ref 0.1–1.0)
MONOS PCT: 9 %
NEUTROS PCT: 63 %
Neutro Abs: 4.8 10*3/uL (ref 1.7–7.7)
Platelets: 183 10*3/uL (ref 150–400)
RBC: 4.23 MIL/uL (ref 4.22–5.81)
RDW: 12.1 % (ref 11.5–15.5)
WBC: 7.8 10*3/uL (ref 4.0–10.5)
nRBC: 0 % (ref 0.0–0.2)

## 2018-07-08 LAB — COMPREHENSIVE METABOLIC PANEL
ALT: 14 U/L (ref 0–44)
AST: 15 U/L (ref 15–41)
Albumin: 3.8 g/dL (ref 3.5–5.0)
Alkaline Phosphatase: 69 U/L (ref 38–126)
Anion gap: 11 (ref 5–15)
BUN: 25 mg/dL — ABNORMAL HIGH (ref 8–23)
CO2: 27 mmol/L (ref 22–32)
Calcium: 9.5 mg/dL (ref 8.9–10.3)
Chloride: 100 mmol/L (ref 98–111)
Creatinine, Ser: 1.37 mg/dL — ABNORMAL HIGH (ref 0.61–1.24)
GFR calc Af Amer: 53 mL/min — ABNORMAL LOW (ref >60)
GFR calc non Af Amer: 46 mL/min — ABNORMAL LOW (ref >60)
Glucose, Bld: 117 mg/dL — ABNORMAL HIGH (ref 70–99)
Potassium: 4.5 mmol/L (ref 3.5–5.1)
Sodium: 138 mmol/L (ref 135–145)
Total Bilirubin: 0.8 mg/dL (ref 0.3–1.2)
Total Protein: 6.8 g/dL (ref 6.5–8.1)

## 2018-07-08 LAB — URINALYSIS, ROUTINE W REFLEX MICROSCOPIC
Bacteria, UA: NONE SEEN
Bilirubin Urine: NEGATIVE
GLUCOSE, UA: NEGATIVE mg/dL
Ketones, ur: NEGATIVE mg/dL
Leukocytes, UA: NEGATIVE
Nitrite: NEGATIVE
PROTEIN: NEGATIVE mg/dL
Specific Gravity, Urine: 1.011 (ref 1.005–1.030)
pH: 5 (ref 5.0–8.0)

## 2018-07-08 LAB — I-STAT CG4 LACTIC ACID, ED: Lactic Acid, Venous: 0.97 mmol/L (ref 0.5–1.9)

## 2018-07-08 LAB — LIPASE, BLOOD: LIPASE: 37 U/L (ref 11–51)

## 2018-07-08 LAB — TSH: TSH: 1.589 u[IU]/mL (ref 0.350–4.500)

## 2018-07-08 MED ORDER — IOHEXOL 300 MG/ML  SOLN
100.0000 mL | Freq: Once | INTRAMUSCULAR | Status: AC | PRN
  Administered 2018-07-08: 100 mL via INTRAVENOUS

## 2018-07-08 NOTE — ED Notes (Signed)
Discharge instructions discussed with Pt. Pt verbalized understanding. Pt stable and ambulatory.    

## 2018-07-08 NOTE — Discharge Instructions (Addendum)
It was our pleasure to provide your ER care today - we hope that you feel better.  Drink plenty of fluids. Get adequate fiber in diet. Take colace 2x/day (stool softener), and take miralax once a day as needed (laxative) - these medications are available over the counter.   Your ct scan was read as follows: 1. No acute abnormality. 2. Descending and sigmoid colon diverticulosis. 3. Stable left adrenal adenoma. 4. Stable 4 mm right middle lobe nodule.   Follow up with primary care doctor in the coming week.  Return to ER if worse, new symptoms, fevers, persistent vomiting, other concern.

## 2018-07-08 NOTE — ED Provider Notes (Signed)
Signed out by Dr Sherry Ruffing to d/c to home after CT resulted.   CT neg for acute process.  Rec colace/miralax prn for constipation.  abd soft nt, afebrile.   Discussed ct results w pt.   Pt currently appears stable for d/c.      Lajean Saver, MD 07/08/18 [place] [place]
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
## 2018-07-08 NOTE — ED Triage Notes (Signed)
Pt reports no BM in two weeks. Denies n/v. Tried miralas at home without relief

## 2018-07-08 NOTE — ED Notes (Signed)
Pt ambulatory to bathroom

## 2018-07-09 LAB — URINE CULTURE: Culture: NO GROWTH

## 2018-08-07 DIAGNOSIS — Z08 Encounter for follow-up examination after completed treatment for malignant neoplasm: Secondary | ICD-10-CM | POA: Diagnosis not present

## 2018-08-07 DIAGNOSIS — X32XXXD Exposure to sunlight, subsequent encounter: Secondary | ICD-10-CM | POA: Diagnosis not present

## 2018-08-07 DIAGNOSIS — L570 Actinic keratosis: Secondary | ICD-10-CM | POA: Diagnosis not present

## 2018-08-07 DIAGNOSIS — C44519 Basal cell carcinoma of skin of other part of trunk: Secondary | ICD-10-CM | POA: Diagnosis not present

## 2018-08-07 DIAGNOSIS — Z85828 Personal history of other malignant neoplasm of skin: Secondary | ICD-10-CM | POA: Diagnosis not present

## 2018-08-07 DIAGNOSIS — L820 Inflamed seborrheic keratosis: Secondary | ICD-10-CM | POA: Diagnosis not present

## 2018-08-08 DIAGNOSIS — R05 Cough: Secondary | ICD-10-CM | POA: Diagnosis not present

## 2018-08-08 DIAGNOSIS — J320 Chronic maxillary sinusitis: Secondary | ICD-10-CM | POA: Diagnosis not present

## 2018-08-08 DIAGNOSIS — H6121 Impacted cerumen, right ear: Secondary | ICD-10-CM | POA: Diagnosis not present

## 2018-08-08 DIAGNOSIS — J370 Chronic laryngitis: Secondary | ICD-10-CM | POA: Diagnosis not present

## 2018-08-08 DIAGNOSIS — K1120 Sialoadenitis, unspecified: Secondary | ICD-10-CM | POA: Diagnosis not present

## 2018-08-08 DIAGNOSIS — J039 Acute tonsillitis, unspecified: Secondary | ICD-10-CM | POA: Diagnosis not present

## 2018-08-14 DIAGNOSIS — J322 Chronic ethmoidal sinusitis: Secondary | ICD-10-CM | POA: Diagnosis not present

## 2018-08-14 DIAGNOSIS — J320 Chronic maxillary sinusitis: Secondary | ICD-10-CM | POA: Diagnosis not present

## 2018-08-28 ENCOUNTER — Encounter (INDEPENDENT_AMBULATORY_CARE_PROVIDER_SITE_OTHER): Payer: Self-pay

## 2018-08-28 ENCOUNTER — Encounter: Payer: Self-pay | Admitting: Cardiology

## 2018-08-28 ENCOUNTER — Ambulatory Visit (INDEPENDENT_AMBULATORY_CARE_PROVIDER_SITE_OTHER): Payer: Medicare Other | Admitting: Cardiology

## 2018-08-28 VITALS — BP 138/62 | HR 54 | Ht 68.0 in | Wt 187.0 lb

## 2018-08-28 DIAGNOSIS — E7800 Pure hypercholesterolemia, unspecified: Secondary | ICD-10-CM

## 2018-08-28 DIAGNOSIS — I10 Essential (primary) hypertension: Secondary | ICD-10-CM | POA: Diagnosis not present

## 2018-08-28 DIAGNOSIS — I2510 Atherosclerotic heart disease of native coronary artery without angina pectoris: Secondary | ICD-10-CM

## 2018-08-28 NOTE — Patient Instructions (Signed)
Medication Instructions:  The current medical regimen is effective;  continue present plan and medications.  If you need a refill on your cardiac medications before your next appointment, please call your pharmacy.   Follow-Up: At [facility], you and your health needs are our priority.  As part of our continuing mission to provide you with exceptional heart care, we have created designated Provider Care Teams.  These Care Teams include your primary Cardiologist (physician) and Advanced Practice Providers (APPs -  Physician Assistants and Nurse Practitioners) who all work together to provide you with the care you need, when you need it. You will need a follow up appointment in 12 months.  Please call our office 2 months in advance to schedule this appointment.  You may see Mark Skains, MD or one of the following Advanced Practice Providers on your designated Care Team:   Lori Gerhardt, NP Laura Ingold, NP . Jill McDaniel, NP  Thank you for choosing [facility]!!      
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
## 2018-09-10 DIAGNOSIS — Z08 Encounter for follow-up examination after completed treatment for malignant neoplasm: Secondary | ICD-10-CM | POA: Diagnosis not present

## 2018-09-10 DIAGNOSIS — Z85828 Personal history of other malignant neoplasm of skin: Secondary | ICD-10-CM | POA: Diagnosis not present

## 2018-09-13 DIAGNOSIS — H401131 Primary open-angle glaucoma, bilateral, mild stage: Secondary | ICD-10-CM | POA: Diagnosis not present

## 2018-10-14 ENCOUNTER — Other Ambulatory Visit: Payer: Self-pay | Admitting: Cardiology

## 2018-10-14 MED ORDER — FUROSEMIDE 40 MG PO TABS: 40.0000 mg | ORAL_TABLET | Freq: Every day | ORAL | 3 refills | Status: DC | PRN

## 2018-11-26 DIAGNOSIS — Z85828 Personal history of other malignant neoplasm of skin: Secondary | ICD-10-CM | POA: Diagnosis not present

## 2018-11-26 DIAGNOSIS — C44712 Basal cell carcinoma of skin of right lower limb, including hip: Secondary | ICD-10-CM | POA: Diagnosis not present

## 2018-11-26 DIAGNOSIS — Z08 Encounter for follow-up examination after completed treatment for malignant neoplasm: Secondary | ICD-10-CM | POA: Diagnosis not present

## 2018-12-18 DIAGNOSIS — Z85828 Personal history of other malignant neoplasm of skin: Secondary | ICD-10-CM | POA: Diagnosis not present

## 2018-12-18 DIAGNOSIS — Z08 Encounter for follow-up examination after completed treatment for malignant neoplasm: Secondary | ICD-10-CM | POA: Diagnosis not present

## 2018-12-18 DIAGNOSIS — L980 Pyogenic granuloma: Secondary | ICD-10-CM | POA: Diagnosis not present

## 2019-02-05 DIAGNOSIS — K5900 Constipation, unspecified: Secondary | ICD-10-CM | POA: Diagnosis not present

## 2019-02-05 DIAGNOSIS — K219 Gastro-esophageal reflux disease without esophagitis: Secondary | ICD-10-CM | POA: Diagnosis not present

## 2019-02-06 ENCOUNTER — Ambulatory Visit
Admission: RE | Admit: 2019-02-06 | Discharge: 2019-02-06 | Disposition: A | Discharge status: Home / Self Care | Payer: Medicare Other | Source: Ambulatory Visit | Attending: Family Medicine | Admitting: Family Medicine

## 2019-02-06 ENCOUNTER — Other Ambulatory Visit: Payer: Self-pay | Admitting: Family Medicine

## 2019-02-06 DIAGNOSIS — K5909 Other constipation: Secondary | ICD-10-CM | POA: Diagnosis not present

## 2019-02-06 DIAGNOSIS — K5900 Constipation, unspecified: Secondary | ICD-10-CM

## 2019-03-07 DIAGNOSIS — K5900 Constipation, unspecified: Secondary | ICD-10-CM | POA: Diagnosis not present

## 2019-03-12 DIAGNOSIS — H52203 Unspecified astigmatism, bilateral: Secondary | ICD-10-CM | POA: Diagnosis not present

## 2019-03-12 DIAGNOSIS — H401131 Primary open-angle glaucoma, bilateral, mild stage: Secondary | ICD-10-CM | POA: Diagnosis not present

## 2019-03-12 DIAGNOSIS — H1859 Other hereditary corneal dystrophies: Secondary | ICD-10-CM | POA: Diagnosis not present

## 2019-03-12 DIAGNOSIS — H43813 Vitreous degeneration, bilateral: Secondary | ICD-10-CM | POA: Diagnosis not present

## 2019-04-15 DIAGNOSIS — Z23 Encounter for immunization: Secondary | ICD-10-CM | POA: Diagnosis not present

## 2019-04-22 DIAGNOSIS — H6123 Impacted cerumen, bilateral: Secondary | ICD-10-CM | POA: Diagnosis not present

## 2019-04-22 DIAGNOSIS — M542 Cervicalgia: Secondary | ICD-10-CM | POA: Diagnosis not present

## 2019-05-09 DIAGNOSIS — I129 Hypertensive chronic kidney disease with stage 1 through stage 4 chronic kidney disease, or unspecified chronic kidney disease: Secondary | ICD-10-CM | POA: Diagnosis not present

## 2019-05-09 DIAGNOSIS — I10 Essential (primary) hypertension: Secondary | ICD-10-CM | POA: Diagnosis not present

## 2019-05-09 DIAGNOSIS — H409 Unspecified glaucoma: Secondary | ICD-10-CM | POA: Diagnosis not present

## 2019-05-09 DIAGNOSIS — E7800 Pure hypercholesterolemia, unspecified: Secondary | ICD-10-CM | POA: Diagnosis not present

## 2019-05-09 DIAGNOSIS — I2510 Atherosclerotic heart disease of native coronary artery without angina pectoris: Secondary | ICD-10-CM | POA: Diagnosis not present

## 2019-05-09 DIAGNOSIS — N1831 Chronic kidney disease, stage 3a: Secondary | ICD-10-CM | POA: Diagnosis not present

## 2019-05-12 DIAGNOSIS — N183 Chronic kidney disease, stage 3 unspecified: Secondary | ICD-10-CM | POA: Diagnosis not present

## 2019-05-12 DIAGNOSIS — K219 Gastro-esophageal reflux disease without esophagitis: Secondary | ICD-10-CM | POA: Diagnosis not present

## 2019-05-12 DIAGNOSIS — R141 Gas pain: Secondary | ICD-10-CM | POA: Diagnosis not present

## 2019-05-12 DIAGNOSIS — Z1389 Encounter for screening for other disorder: Secondary | ICD-10-CM | POA: Diagnosis not present

## 2019-05-12 DIAGNOSIS — I2510 Atherosclerotic heart disease of native coronary artery without angina pectoris: Secondary | ICD-10-CM | POA: Diagnosis not present

## 2019-05-12 DIAGNOSIS — M542 Cervicalgia: Secondary | ICD-10-CM | POA: Diagnosis not present

## 2019-05-12 DIAGNOSIS — I129 Hypertensive chronic kidney disease with stage 1 through stage 4 chronic kidney disease, or unspecified chronic kidney disease: Secondary | ICD-10-CM | POA: Diagnosis not present

## 2019-05-12 DIAGNOSIS — R609 Edema, unspecified: Secondary | ICD-10-CM | POA: Diagnosis not present

## 2019-05-12 DIAGNOSIS — E7800 Pure hypercholesterolemia, unspecified: Secondary | ICD-10-CM | POA: Diagnosis not present

## 2019-05-12 DIAGNOSIS — K5900 Constipation, unspecified: Secondary | ICD-10-CM | POA: Diagnosis not present

## 2019-05-12 DIAGNOSIS — Z0000 Encounter for general adult medical examination without abnormal findings: Secondary | ICD-10-CM | POA: Diagnosis not present

## 2019-06-14 DIAGNOSIS — U071 COVID-19: Secondary | ICD-10-CM | POA: Diagnosis not present

## 2019-06-16 DIAGNOSIS — U071 COVID-19: Secondary | ICD-10-CM | POA: Diagnosis not present

## 2019-06-16 DIAGNOSIS — J189 Pneumonia, unspecified organism: Secondary | ICD-10-CM | POA: Diagnosis not present

## 2019-08-01 ENCOUNTER — Ambulatory Visit: Payer: Medicare Other | Attending: Internal Medicine

## 2019-08-01 DIAGNOSIS — Z23 Encounter for immunization: Secondary | ICD-10-CM

## 2019-08-01 NOTE — Progress Notes (Signed)
   Covid-19 Vaccination Clinic  Name:  [name]    MRN: [id number] DOB: 05/09/1929  08/01/2019  [name] was observed post Covid-19 immunization for 15 minutes without incidence. He was provided with Vaccine Information Sheet and instruction to access the V-Safe system.   [name] was instructed to call 911 with any severe reactions post vaccine: Marland Kitchen Difficulty breathing  . Swelling of your face and throat  . A fast heartbeat  . A bad rash all over your body  . Dizziness and weakness    Immunizations Administered    Name Date Dose VIS Date Route   Pfizer COVID-19 Vaccine 08/01/2019 12:18 PM 0.3 mL 06/20/2019 Intramuscular   Manufacturer: [place]   Lot: [id number]   [place]: [id number]

## 2019-08-07 DIAGNOSIS — I129 Hypertensive chronic kidney disease with stage 1 through stage 4 chronic kidney disease, or unspecified chronic kidney disease: Secondary | ICD-10-CM | POA: Diagnosis not present

## 2019-08-07 DIAGNOSIS — I10 Essential (primary) hypertension: Secondary | ICD-10-CM | POA: Diagnosis not present

## 2019-08-07 DIAGNOSIS — I2510 Atherosclerotic heart disease of native coronary artery without angina pectoris: Secondary | ICD-10-CM | POA: Diagnosis not present

## 2019-08-07 DIAGNOSIS — H409 Unspecified glaucoma: Secondary | ICD-10-CM | POA: Diagnosis not present

## 2019-08-07 DIAGNOSIS — E7800 Pure hypercholesterolemia, unspecified: Secondary | ICD-10-CM | POA: Diagnosis not present

## 2019-08-22 ENCOUNTER — Ambulatory Visit: Payer: Medicare Other | Attending: Internal Medicine

## 2019-08-22 DIAGNOSIS — Z23 Encounter for immunization: Secondary | ICD-10-CM | POA: Insufficient documentation

## 2019-08-22 NOTE — Progress Notes (Signed)
   Covid-19 Vaccination Clinic  Name:  [name]    MRN: [id number] DOB: 08/25/1928  08/22/2019  [name] was observed post Covid-19 immunization for 15 minutes without incidence. He was provided with Vaccine Information Sheet and instruction to access the V-Safe system.   [name] was instructed to call 911 with any severe reactions post vaccine: Marland Kitchen Difficulty breathing  . Swelling of your face and throat  . A fast heartbeat  . A bad rash all over your body  . Dizziness and weakness    Immunizations Administered    Name Date Dose VIS Date Route   Pfizer COVID-19 Vaccine 08/22/2019 11:03 AM 0.3 mL 06/20/2019 Intramuscular   Manufacturer: [place]   Lot: [id number]   [place]: [id number]

## 2019-09-09 DIAGNOSIS — H04123 Dry eye syndrome of bilateral lacrimal glands: Secondary | ICD-10-CM | POA: Diagnosis not present

## 2019-09-09 DIAGNOSIS — H18593 Other hereditary corneal dystrophies, bilateral: Secondary | ICD-10-CM | POA: Diagnosis not present

## 2019-09-09 DIAGNOSIS — H401131 Primary open-angle glaucoma, bilateral, mild stage: Secondary | ICD-10-CM | POA: Diagnosis not present

## 2019-10-27 DIAGNOSIS — I2510 Atherosclerotic heart disease of native coronary artery without angina pectoris: Secondary | ICD-10-CM | POA: Diagnosis not present

## 2019-10-27 DIAGNOSIS — I10 Essential (primary) hypertension: Secondary | ICD-10-CM | POA: Diagnosis not present

## 2019-10-27 DIAGNOSIS — I129 Hypertensive chronic kidney disease with stage 1 through stage 4 chronic kidney disease, or unspecified chronic kidney disease: Secondary | ICD-10-CM | POA: Diagnosis not present

## 2019-10-27 DIAGNOSIS — N1831 Chronic kidney disease, stage 3a: Secondary | ICD-10-CM | POA: Diagnosis not present

## 2019-10-27 DIAGNOSIS — E7800 Pure hypercholesterolemia, unspecified: Secondary | ICD-10-CM | POA: Diagnosis not present

## 2019-10-27 DIAGNOSIS — H409 Unspecified glaucoma: Secondary | ICD-10-CM | POA: Diagnosis not present

## 2019-11-07 ENCOUNTER — Encounter: Payer: Self-pay | Admitting: Cardiology

## 2019-11-07 ENCOUNTER — Other Ambulatory Visit: Payer: Self-pay

## 2019-11-07 ENCOUNTER — Ambulatory Visit (INDEPENDENT_AMBULATORY_CARE_PROVIDER_SITE_OTHER): Payer: Medicare Other | Admitting: Cardiology

## 2019-11-07 VITALS — BP 160/60 | HR 58 | Ht 68.0 in | Wt 177.0 lb

## 2019-11-07 DIAGNOSIS — I2510 Atherosclerotic heart disease of native coronary artery without angina pectoris: Secondary | ICD-10-CM

## 2019-11-07 DIAGNOSIS — I10 Essential (primary) hypertension: Secondary | ICD-10-CM

## 2019-11-07 DIAGNOSIS — E7800 Pure hypercholesterolemia, unspecified: Secondary | ICD-10-CM

## 2019-11-07 NOTE — Progress Notes (Signed)
Cardiology Office Note:    Date:  11/07/2019   ID:  [name], DOB 01-22-1929, MRN [id number]  PCP:  Maury Dus, MD  Cardiologist:  Candee Furbish, MD  Electrophysiologist:  None   Referring MD: Maury Dus, MD     History of Present Illness:    [name] is a 86 y.o. male here for the follow-up of coronary artery disease.  Back in 2001 had percutaneous intervention.  Had a prior history of smoking hypertension hyperlipidemia.  2017-nuclear stress test low risk.  Change Lasix to when necessary after creatinine increased.  In December, tested positive for Covid.  Did have a mild case of pneumonia.  His son also tested positive.  Overall still battling with abdominal distention.  Taking MiraLAX more than once a day.  Has had abdominal CT last one in 2019.  No acute findings.  Diverticulosis noted.  Past Medical History:  Diagnosis Date  . CAD (coronary artery disease)   . Hypercholesteremia   . Hyperlipidemia   . Hypertension   . Spleen injury     Past Surgical History:  Procedure Laterality Date  . CHOLECYSTECTOMY    . CORONARY STENT PLACEMENT    . THYROID SURGERY     growth removed    Current Medications: Current Meds  Medication Sig  . Cholecalciferol (VITAMIN D-3) 1000 UNITS CAPS Take 2,000 Units by mouth daily.  . Coenzyme Q10 (CO Q 10) 10 MG CAPS Take 10 mg by mouth daily.  . fish oil-omega-3 fatty acids 1000 MG capsule Take 1 g by mouth daily.  . furosemide (LASIX) 40 MG tablet Take 1 tablet (40 mg total) by mouth daily as needed.  . latanoprost (XALATAN) 0.005 % ophthalmic solution Place 1 drop into both eyes at bedtime.   . metoprolol tartrate (LOPRESSOR) 25 MG tablet Take 25 mg by mouth 2 (two) times daily.  . Probiotic CAPS Take 1 capsule by mouth daily.  . rosuvastatin (CRESTOR) 20 MG tablet Take 20 mg by mouth at bedtime.   . senna (SENOKOT) 8.6 MG TABS tablet Take 1 tablet by mouth daily as needed for mild constipation.  .  valsartan-hydrochlorothiazide (DIOVAN-HCT) 320-25 MG per tablet Take 1 tablet by mouth daily.      Allergies:   Morphine and related and Avelox [moxifloxacin hcl in nacl]   Social History   Socioeconomic History  . Marital status: Married    Spouse name: Not on file  . Number of children: Not on file  . Years of education: Not on file  . Highest education level: Not on file  Occupational History  . Not on file  Tobacco Use  . Smoking status: Never Smoker  . Smokeless tobacco: Never Used  Substance and Sexual Activity  . Alcohol use: No  . Drug use: No  . Sexual activity: Not on file  Other Topics Concern  . Not on file  Social History Narrative  . Not on file   Social Determinants of Health   Financial Resource Strain:   . Difficulty of Paying Living Expenses:   Food Insecurity:   . Worried About Charity fundraiser in the Last Year:   . Arboriculturist in the Last Year:   Transportation Needs:   . Film/video editor (Medical):   Marland Kitchen Lack of Transportation (Non-Medical):   Physical Activity:   . Days of Exercise per Week:   . Minutes of Exercise per Session:   Stress:   . Feeling of  Stress :   Social Connections:   . Frequency of Communication with Friends and Family:   . Frequency of Social Gatherings with Friends and Family:   . Attends Religious Services:   . Active Member of Clubs or Organizations:   . Attends Archivist Meetings:   Marland Kitchen Marital Status:      Family History: The patient's family history includes Heart attack in his father; Heart disease in his father.  ROS:   Please see the history of present illness.     All other systems reviewed and are negative.  EKGs/Labs/Other Studies Reviewed:    The following studies were reviewed today:  NUC: 11/12/15: Normal ejection fraction, no significant ischemia identified. Reassuring, low risk.  EKG:  EKG is  ordered today.  The ekg ordered today demonstrates sinus rhythm/sinus bradycardia 58  with PVC no other abnormalities 08/28/2018-sinus bradycardia first-degree AV block 54 heart rate.  212 ms.  Personally reviewed and interpreted.  08/31/17-sinus bradycardia 56 with no other obvious abnormalities.  Personally viewed-prior 09/13/15-sinus rhythm, 53, no other abnormalities.04/30/14-sinus bradycardia no change.  Recent Labs: No results found for requested labs within last 8760 hours.  Recent Lipid Panel    Component Value Date/Time   CHOL  04/05/2007 0310    80        ATP III CLASSIFICATION:  <200     mg/dL   Desirable  200-239  mg/dL   Borderline High  >=240    mg/dL   High   TRIG 87 04/05/2007 0310   HDL 29 (L) 04/05/2007 0310   CHOLHDL 2.8 04/05/2007 0310   VLDL 17 04/05/2007 0310   LDLCALC  04/05/2007 0310    34        Total Cholesterol/HDL:CHD Risk Coronary Heart Disease Risk Table                     Men   Women  1/2 Average Risk   3.4   3.3    Physical Exam:    VS:  BP (!) 160/60   Pulse (!) 58   Ht 5\' 8"  (1.727 m)   Wt 177 lb (80.3 kg)   SpO2 98%   BMI 26.91 kg/m     Wt Readings from Last 3 Encounters:  11/07/19 177 lb (80.3 kg)  08/28/18 187 lb (84.8 kg)  07/08/18 185 lb (83.9 kg)     GEN:  Well nourished, well developed in no acute distress HEENT: Normal NECK: No JVD; No carotid bruits LYMPHATICS: No lymphadenopathy CARDIAC: RRR, no murmurs, rubs, gallops RESPIRATORY:  Clear to auscultation without rales, wheezing or rhonchi  ABDOMEN: Soft, non-tender, distended MUSCULOSKELETAL:  2+ BLE edema; No deformity  SKIN: Warm and dry NEUROLOGIC:  Alert and oriented x 3 + neuropathy PSYCHIATRIC:  Normal affect   ASSESSMENT:    1. Coronary artery disease involving native coronary artery of native heart without angina pectoris   2. Essential hypertension   3. Pure hypercholesterolemia    PLAN:    In order of problems listed above:  CAD  -Prior PCI 2001  - Reassuring nuclear stress test as above.  - Shortness of breath, he is still taking Lasix  as needed but rarely needs it.  Doing well without any anginal symptoms.  Continues to do well.  Hyperlipidemia -continue with Crestor 20 mg. Dr. Alyson Ingles has been checking.  Excellent.  LDL in the 30s previously.  No myalgias.  Hypertension -Mildly elevated today here in the office, sometimes this  happens in the doctor's office.  He is on valsartan hydrochlorothiazide.  He is also on metoprolol.  Lower extremity edema -Occasionally he takes as needed Lasix.  We have to be careful because of prior increased creatinine   Medication Adjustments/Labs and Tests Ordered: Current medicines are reviewed at length with the patient today.  Concerns regarding medicines are outlined above.  Orders Placed This Encounter  Procedures  . EKG 12-Lead   No orders of the defined types were placed in this encounter.   Patient Instructions  Medication Instructions:  The current medical regimen is effective;  continue present plan and medications.  *If you need a refill on your cardiac medications before your next appointment, please call your pharmacy*  Follow-Up: At [facility], you and your health needs are our priority.  As part of our continuing mission to provide you with exceptional heart care, we have created designated Provider Care Teams.  These Care Teams include your primary Cardiologist (physician) and Advanced Practice Providers (APPs -  Physician Assistants and Nurse Practitioners) who all work together to provide you with the care you need, when you need it.  We recommend signing up for the patient portal called "MyChart".  Sign up information is provided on this After Visit Summary.  MyChart is used to connect with patients for Virtual Visits (Telemedicine).  Patients are able to view lab/test results, encounter notes, upcoming appointments, etc.  Non-urgent messages can be sent to your provider as well.   To learn more about what you can do with MyChart, go to [email or web address].     Your next appointment:   12 month(s)  The format for your next appointment:   In Person  Provider:   Candee Furbish, MD  Thank you for choosing [facility]!!        Signed, Candee Furbish, MD  11/07/2019 5:11 PM    [name]

## 2019-11-07 NOTE — Patient Instructions (Signed)
Medication Instructions:  The current medical regimen is effective;  continue present plan and medications.  *If you need a refill on your cardiac medications before your next appointment, please call your pharmacy*  Follow-Up: At [facility], you and your health needs are our priority.  As part of our continuing mission to provide you with exceptional heart care, we have created designated Provider Care Teams.  These Care Teams include your primary Cardiologist (physician) and Advanced Practice Providers (APPs -  Physician Assistants and Nurse Practitioners) who all work together to provide you with the care you need, when you need it.  We recommend signing up for the patient portal called "MyChart".  Sign up information is provided on this After Visit Summary.  MyChart is used to connect with patients for Virtual Visits (Telemedicine).  Patients are able to view lab/test results, encounter notes, upcoming appointments, etc.  Non-urgent messages can be sent to your provider as well.   To learn more about what you can do with MyChart, go to [email or web address].    Your next appointment:   12 month(s)  The format for your next appointment:   In Person  Provider:   Mark Skains, MD   Thank you for choosing [facility]!!      

## 2019-11-10 DIAGNOSIS — R141 Gas pain: Secondary | ICD-10-CM | POA: Diagnosis not present

## 2019-11-10 DIAGNOSIS — M542 Cervicalgia: Secondary | ICD-10-CM | POA: Diagnosis not present

## 2019-11-10 DIAGNOSIS — I129 Hypertensive chronic kidney disease with stage 1 through stage 4 chronic kidney disease, or unspecified chronic kidney disease: Secondary | ICD-10-CM | POA: Diagnosis not present

## 2019-11-10 DIAGNOSIS — K5900 Constipation, unspecified: Secondary | ICD-10-CM | POA: Diagnosis not present

## 2019-11-10 DIAGNOSIS — R609 Edema, unspecified: Secondary | ICD-10-CM | POA: Diagnosis not present

## 2019-11-10 DIAGNOSIS — I2510 Atherosclerotic heart disease of native coronary artery without angina pectoris: Secondary | ICD-10-CM | POA: Diagnosis not present

## 2019-11-10 DIAGNOSIS — E7800 Pure hypercholesterolemia, unspecified: Secondary | ICD-10-CM | POA: Diagnosis not present

## 2019-11-10 DIAGNOSIS — N1831 Chronic kidney disease, stage 3a: Secondary | ICD-10-CM | POA: Diagnosis not present

## 2019-11-10 DIAGNOSIS — K219 Gastro-esophageal reflux disease without esophagitis: Secondary | ICD-10-CM | POA: Diagnosis not present

## 2019-11-10 DIAGNOSIS — G629 Polyneuropathy, unspecified: Secondary | ICD-10-CM | POA: Diagnosis not present

## 2019-12-04 DIAGNOSIS — N1831 Chronic kidney disease, stage 3a: Secondary | ICD-10-CM | POA: Diagnosis not present

## 2019-12-22 DIAGNOSIS — H04123 Dry eye syndrome of bilateral lacrimal glands: Secondary | ICD-10-CM | POA: Diagnosis not present

## 2019-12-22 DIAGNOSIS — H401131 Primary open-angle glaucoma, bilateral, mild stage: Secondary | ICD-10-CM | POA: Diagnosis not present

## 2019-12-22 DIAGNOSIS — H18593 Other hereditary corneal dystrophies, bilateral: Secondary | ICD-10-CM | POA: Diagnosis not present

## 2020-03-06 DIAGNOSIS — R0602 Shortness of breath: Secondary | ICD-10-CM | POA: Diagnosis not present

## 2020-03-06 DIAGNOSIS — R1084 Generalized abdominal pain: Secondary | ICD-10-CM | POA: Diagnosis not present

## 2020-03-09 DIAGNOSIS — I10 Essential (primary) hypertension: Secondary | ICD-10-CM | POA: Diagnosis not present

## 2020-03-09 DIAGNOSIS — H409 Unspecified glaucoma: Secondary | ICD-10-CM | POA: Diagnosis not present

## 2020-03-09 DIAGNOSIS — I129 Hypertensive chronic kidney disease with stage 1 through stage 4 chronic kidney disease, or unspecified chronic kidney disease: Secondary | ICD-10-CM | POA: Diagnosis not present

## 2020-03-09 DIAGNOSIS — K219 Gastro-esophageal reflux disease without esophagitis: Secondary | ICD-10-CM | POA: Diagnosis not present

## 2020-03-09 DIAGNOSIS — N1831 Chronic kidney disease, stage 3a: Secondary | ICD-10-CM | POA: Diagnosis not present

## 2020-03-09 DIAGNOSIS — E7800 Pure hypercholesterolemia, unspecified: Secondary | ICD-10-CM | POA: Diagnosis not present

## 2020-03-09 DIAGNOSIS — I2510 Atherosclerotic heart disease of native coronary artery without angina pectoris: Secondary | ICD-10-CM | POA: Diagnosis not present

## 2020-03-23 DIAGNOSIS — R140 Abdominal distension (gaseous): Secondary | ICD-10-CM | POA: Diagnosis not present

## 2020-03-23 DIAGNOSIS — Z23 Encounter for immunization: Secondary | ICD-10-CM | POA: Diagnosis not present

## 2020-03-23 DIAGNOSIS — I509 Heart failure, unspecified: Secondary | ICD-10-CM | POA: Diagnosis not present

## 2020-03-23 DIAGNOSIS — K5901 Slow transit constipation: Secondary | ICD-10-CM | POA: Diagnosis not present

## 2020-03-23 DIAGNOSIS — Z86010 Personal history of colonic polyps: Secondary | ICD-10-CM | POA: Diagnosis not present

## 2020-04-26 DIAGNOSIS — I509 Heart failure, unspecified: Secondary | ICD-10-CM | POA: Diagnosis not present

## 2020-04-26 DIAGNOSIS — I10 Essential (primary) hypertension: Secondary | ICD-10-CM | POA: Diagnosis not present

## 2020-04-26 DIAGNOSIS — N1831 Chronic kidney disease, stage 3a: Secondary | ICD-10-CM | POA: Diagnosis not present

## 2020-04-26 DIAGNOSIS — H409 Unspecified glaucoma: Secondary | ICD-10-CM | POA: Diagnosis not present

## 2020-04-26 DIAGNOSIS — I2510 Atherosclerotic heart disease of native coronary artery without angina pectoris: Secondary | ICD-10-CM | POA: Diagnosis not present

## 2020-04-26 DIAGNOSIS — E7800 Pure hypercholesterolemia, unspecified: Secondary | ICD-10-CM | POA: Diagnosis not present

## 2020-04-26 DIAGNOSIS — K219 Gastro-esophageal reflux disease without esophagitis: Secondary | ICD-10-CM | POA: Diagnosis not present

## 2020-04-26 DIAGNOSIS — I129 Hypertensive chronic kidney disease with stage 1 through stage 4 chronic kidney disease, or unspecified chronic kidney disease: Secondary | ICD-10-CM | POA: Diagnosis not present

## 2020-05-17 DIAGNOSIS — G629 Polyneuropathy, unspecified: Secondary | ICD-10-CM | POA: Diagnosis not present

## 2020-05-17 DIAGNOSIS — I129 Hypertensive chronic kidney disease with stage 1 through stage 4 chronic kidney disease, or unspecified chronic kidney disease: Secondary | ICD-10-CM | POA: Diagnosis not present

## 2020-05-17 DIAGNOSIS — N1831 Chronic kidney disease, stage 3a: Secondary | ICD-10-CM | POA: Diagnosis not present

## 2020-05-17 DIAGNOSIS — R609 Edema, unspecified: Secondary | ICD-10-CM | POA: Diagnosis not present

## 2020-05-17 DIAGNOSIS — I2510 Atherosclerotic heart disease of native coronary artery without angina pectoris: Secondary | ICD-10-CM | POA: Diagnosis not present

## 2020-05-17 DIAGNOSIS — Z1389 Encounter for screening for other disorder: Secondary | ICD-10-CM | POA: Diagnosis not present

## 2020-05-17 DIAGNOSIS — K219 Gastro-esophageal reflux disease without esophagitis: Secondary | ICD-10-CM | POA: Diagnosis not present

## 2020-05-17 DIAGNOSIS — I509 Heart failure, unspecified: Secondary | ICD-10-CM | POA: Diagnosis not present

## 2020-05-17 DIAGNOSIS — E7800 Pure hypercholesterolemia, unspecified: Secondary | ICD-10-CM | POA: Diagnosis not present

## 2020-05-17 DIAGNOSIS — K2970 Gastritis, unspecified, without bleeding: Secondary | ICD-10-CM | POA: Diagnosis not present

## 2020-05-17 DIAGNOSIS — K5900 Constipation, unspecified: Secondary | ICD-10-CM | POA: Diagnosis not present

## 2020-05-17 DIAGNOSIS — Z0000 Encounter for general adult medical examination without abnormal findings: Secondary | ICD-10-CM | POA: Diagnosis not present

## 2020-06-12 DIAGNOSIS — Z03818 Encounter for observation for suspected exposure to other biological agents ruled out: Secondary | ICD-10-CM | POA: Diagnosis not present

## 2020-06-12 DIAGNOSIS — Z20822 Contact with and (suspected) exposure to covid-19: Secondary | ICD-10-CM | POA: Diagnosis not present

## 2020-06-18 DIAGNOSIS — H18593 Other hereditary corneal dystrophies, bilateral: Secondary | ICD-10-CM | POA: Diagnosis not present

## 2020-06-18 DIAGNOSIS — H43813 Vitreous degeneration, bilateral: Secondary | ICD-10-CM | POA: Diagnosis not present

## 2020-06-18 DIAGNOSIS — H401131 Primary open-angle glaucoma, bilateral, mild stage: Secondary | ICD-10-CM | POA: Diagnosis not present

## 2020-06-18 DIAGNOSIS — H524 Presbyopia: Secondary | ICD-10-CM | POA: Diagnosis not present

## 2020-06-25 DIAGNOSIS — D649 Anemia, unspecified: Secondary | ICD-10-CM | POA: Diagnosis not present

## 2020-06-28 DIAGNOSIS — D649 Anemia, unspecified: Secondary | ICD-10-CM | POA: Diagnosis not present

## 2020-07-22 DIAGNOSIS — H9113 Presbycusis, bilateral: Secondary | ICD-10-CM | POA: Diagnosis not present

## 2020-07-22 DIAGNOSIS — H6123 Impacted cerumen, bilateral: Secondary | ICD-10-CM | POA: Diagnosis not present

## 2020-08-09 DIAGNOSIS — E7800 Pure hypercholesterolemia, unspecified: Secondary | ICD-10-CM | POA: Diagnosis not present

## 2020-08-09 DIAGNOSIS — I2510 Atherosclerotic heart disease of native coronary artery without angina pectoris: Secondary | ICD-10-CM | POA: Diagnosis not present

## 2020-08-09 DIAGNOSIS — N1831 Chronic kidney disease, stage 3a: Secondary | ICD-10-CM | POA: Diagnosis not present

## 2020-08-09 DIAGNOSIS — H409 Unspecified glaucoma: Secondary | ICD-10-CM | POA: Diagnosis not present

## 2020-08-09 DIAGNOSIS — K219 Gastro-esophageal reflux disease without esophagitis: Secondary | ICD-10-CM | POA: Diagnosis not present

## 2020-08-09 DIAGNOSIS — I509 Heart failure, unspecified: Secondary | ICD-10-CM | POA: Diagnosis not present

## 2020-08-09 DIAGNOSIS — I129 Hypertensive chronic kidney disease with stage 1 through stage 4 chronic kidney disease, or unspecified chronic kidney disease: Secondary | ICD-10-CM | POA: Diagnosis not present

## 2020-08-09 DIAGNOSIS — I10 Essential (primary) hypertension: Secondary | ICD-10-CM | POA: Diagnosis not present

## 2020-08-31 DIAGNOSIS — D649 Anemia, unspecified: Secondary | ICD-10-CM | POA: Diagnosis not present

## 2020-09-27 DIAGNOSIS — I2510 Atherosclerotic heart disease of native coronary artery without angina pectoris: Secondary | ICD-10-CM | POA: Diagnosis not present

## 2020-09-27 DIAGNOSIS — N1831 Chronic kidney disease, stage 3a: Secondary | ICD-10-CM | POA: Diagnosis not present

## 2020-09-27 DIAGNOSIS — I10 Essential (primary) hypertension: Secondary | ICD-10-CM | POA: Diagnosis not present

## 2020-09-27 DIAGNOSIS — I509 Heart failure, unspecified: Secondary | ICD-10-CM | POA: Diagnosis not present

## 2020-09-27 DIAGNOSIS — H409 Unspecified glaucoma: Secondary | ICD-10-CM | POA: Diagnosis not present

## 2020-09-27 DIAGNOSIS — I129 Hypertensive chronic kidney disease with stage 1 through stage 4 chronic kidney disease, or unspecified chronic kidney disease: Secondary | ICD-10-CM | POA: Diagnosis not present

## 2020-09-27 DIAGNOSIS — E7800 Pure hypercholesterolemia, unspecified: Secondary | ICD-10-CM | POA: Diagnosis not present

## 2020-09-27 DIAGNOSIS — K219 Gastro-esophageal reflux disease without esophagitis: Secondary | ICD-10-CM | POA: Diagnosis not present

## 2020-12-31 DIAGNOSIS — H18593 Other hereditary corneal dystrophies, bilateral: Secondary | ICD-10-CM | POA: Diagnosis not present

## 2020-12-31 DIAGNOSIS — H04123 Dry eye syndrome of bilateral lacrimal glands: Secondary | ICD-10-CM | POA: Diagnosis not present

## 2020-12-31 DIAGNOSIS — H401131 Primary open-angle glaucoma, bilateral, mild stage: Secondary | ICD-10-CM | POA: Diagnosis not present

## 2021-03-29 ENCOUNTER — Other Ambulatory Visit: Payer: Self-pay

## 2021-03-29 ENCOUNTER — Emergency Department (HOSPITAL_COMMUNITY)
Admission: EM | Admit: 2021-03-29 | Discharge: 2021-03-30 | Disposition: A | Discharge status: Home / Self Care | Payer: Medicare Other | Attending: Emergency Medicine | Admitting: Emergency Medicine

## 2021-03-29 DIAGNOSIS — R1084 Generalized abdominal pain: Secondary | ICD-10-CM

## 2021-03-29 DIAGNOSIS — I10 Essential (primary) hypertension: Secondary | ICD-10-CM | POA: Diagnosis not present

## 2021-03-29 DIAGNOSIS — Z79899 Other long term (current) drug therapy: Secondary | ICD-10-CM | POA: Insufficient documentation

## 2021-03-29 DIAGNOSIS — K5901 Slow transit constipation: Secondary | ICD-10-CM

## 2021-03-29 DIAGNOSIS — N281 Cyst of kidney, acquired: Secondary | ICD-10-CM | POA: Diagnosis not present

## 2021-03-29 DIAGNOSIS — K5900 Constipation, unspecified: Secondary | ICD-10-CM | POA: Diagnosis present

## 2021-03-29 DIAGNOSIS — K769 Liver disease, unspecified: Secondary | ICD-10-CM | POA: Diagnosis not present

## 2021-03-29 DIAGNOSIS — I2510 Atherosclerotic heart disease of native coronary artery without angina pectoris: Secondary | ICD-10-CM | POA: Insufficient documentation

## 2021-03-29 DIAGNOSIS — N261 Atrophy of kidney (terminal): Secondary | ICD-10-CM | POA: Diagnosis not present

## 2021-03-29 DIAGNOSIS — R140 Abdominal distension (gaseous): Secondary | ICD-10-CM | POA: Diagnosis not present

## 2021-03-29 LAB — COMPREHENSIVE METABOLIC PANEL
ALT: 14 U/L (ref 0–44)
AST: 16 U/L (ref 15–41)
Albumin: 3.4 g/dL — ABNORMAL LOW (ref 3.5–5.0)
Alkaline Phosphatase: 62 U/L (ref 38–126)
Anion gap: 10 (ref 5–15)
BUN: 23 mg/dL (ref 8–23)
CO2: 28 mmol/L (ref 22–32)
Calcium: 8.8 mg/dL — ABNORMAL LOW (ref 8.9–10.3)
Chloride: 95 mmol/L — ABNORMAL LOW (ref 98–111)
Creatinine, Ser: 1.54 mg/dL — ABNORMAL HIGH (ref 0.61–1.24)
GFR, Estimated: 43 mL/min — ABNORMAL LOW (ref >60)
Glucose, Bld: 120 mg/dL — ABNORMAL HIGH (ref 70–99)
Potassium: 4.3 mmol/L (ref 3.5–5.1)
Sodium: 133 mmol/L — ABNORMAL LOW (ref 135–145)
Total Bilirubin: 0.8 mg/dL (ref 0.3–1.2)
Total Protein: 5.7 g/dL — ABNORMAL LOW (ref 6.5–8.1)

## 2021-03-29 LAB — CBC
HCT: 35.3 % — ABNORMAL LOW (ref 39.0–52.0)
Hemoglobin: 11.6 g/dL — ABNORMAL LOW (ref 13.0–17.0)
MCH: 30.6 pg (ref 26.0–34.0)
MCHC: 32.9 g/dL (ref 30.0–36.0)
MCV: 93.1 fL (ref 80.0–100.0)
Platelets: 193 10*3/uL (ref 150–400)
RBC: 3.79 MIL/uL — ABNORMAL LOW (ref 4.22–5.81)
RDW: 11.9 % (ref 11.5–15.5)
WBC: 6.3 10*3/uL (ref 4.0–10.5)
nRBC: 0 % (ref 0.0–0.2)

## 2021-03-29 LAB — LIPASE, BLOOD: Lipase: 50 U/L (ref 11–51)

## 2021-03-29 NOTE — ED Triage Notes (Signed)
Pt reports the urge to defecate but is unable to go. Pt states he has taken multiple OTC medications with no results. Pt reports abdominal pain and bloating.

## 2021-03-29 NOTE — ED Provider Notes (Signed)
Emergency Medicine Provider Triage Evaluation Note  [name] , a 87 y.o. male  was evaluated in triage.  Pt complains of abdominal pain and constipation. Symptoms have been ongoing for months but seem to be getting worse. He has the urge to defecate but is unable to go. Has tried several OTC medications with no relief. Can't remember his last BM that wasn't just water.  Review of Systems  Positive: Abdominal pain, bloating, constipation, nausea Negative: Fevers, chills, diarrhea, vomiting  Physical Exam  BP (!) 104/46 (BP Location: Left Arm)   Pulse (!) 52   Temp 98.2 F (36.8 C) (Oral)   Resp 16   SpO2 97%  Gen:   Awake, no distress   Resp:  Normal effort  MSK:   Moves extremities without difficulty  Other:  Abdomen distended, generalized TTP  Medical Decision Making  Medically screening exam initiated at 4:14 PM.  Appropriate orders placed.  Rebecca Eaton was informed that the remainder of the evaluation will be completed by another provider, this initial triage assessment does not replace that evaluation, and the importance of remaining in the ED until their evaluation is complete.     Estill Cotta 03/29/21 1615    Tegeler, Gwenyth Allegra, MD 03/29/21 1901

## 2021-03-30 ENCOUNTER — Emergency Department (HOSPITAL_COMMUNITY): Payer: Medicare Other

## 2021-03-30 DIAGNOSIS — K769 Liver disease, unspecified: Secondary | ICD-10-CM | POA: Diagnosis not present

## 2021-03-30 DIAGNOSIS — R1084 Generalized abdominal pain: Secondary | ICD-10-CM | POA: Diagnosis not present

## 2021-03-30 DIAGNOSIS — N281 Cyst of kidney, acquired: Secondary | ICD-10-CM | POA: Diagnosis not present

## 2021-03-30 DIAGNOSIS — R140 Abdominal distension (gaseous): Secondary | ICD-10-CM | POA: Diagnosis not present

## 2021-03-30 DIAGNOSIS — N261 Atrophy of kidney (terminal): Secondary | ICD-10-CM | POA: Diagnosis not present

## 2021-03-30 DIAGNOSIS — K5901 Slow transit constipation: Secondary | ICD-10-CM | POA: Diagnosis not present

## 2021-03-30 MED ORDER — SENNA 8.6 MG PO TABS
2.0000 | ORAL_TABLET | Freq: Once | ORAL | Status: AC
  Administered 2021-03-30: 17.2 mg via ORAL
  Filled 2021-03-30: qty 2

## 2021-03-30 NOTE — ED Notes (Signed)
Patient verbalizes understanding of discharge instructions. Opportunity for questioning and answers were provided. Armband removed by staff, pt discharged from ED ambulatory.   

## 2021-03-30 NOTE — ED Notes (Signed)
One half bed pan of liquid stool emptied. Pt remains sitting on bedside commode.

## 2021-03-30 NOTE — ED Notes (Signed)
Requested soap suds enema from supply; will administer once it is received on unit.

## 2021-03-30 NOTE — Discharge Instructions (Signed)
You may take miralax and senna up to four times daily for the next week as needed for constipation.    Get rechecked if you develop fevers or worsening pain.
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
## 2021-03-30 NOTE — ED Notes (Signed)
Notified provider of pt's elevated BP.

## 2021-03-30 NOTE — ED Notes (Signed)
Aprox 1527mL of soap suds enema given - pt now sitting on bedside commode

## 2021-05-05 DIAGNOSIS — Z23 Encounter for immunization: Secondary | ICD-10-CM | POA: Diagnosis not present

## 2021-07-12 DIAGNOSIS — H18593 Other hereditary corneal dystrophies, bilateral: Secondary | ICD-10-CM | POA: Diagnosis not present

## 2021-07-12 DIAGNOSIS — H524 Presbyopia: Secondary | ICD-10-CM | POA: Diagnosis not present

## 2021-07-12 DIAGNOSIS — H401131 Primary open-angle glaucoma, bilateral, mild stage: Secondary | ICD-10-CM | POA: Diagnosis not present

## 2021-07-12 DIAGNOSIS — H353132 Nonexudative age-related macular degeneration, bilateral, intermediate dry stage: Secondary | ICD-10-CM | POA: Diagnosis not present

## 2021-08-18 DIAGNOSIS — I10 Essential (primary) hypertension: Secondary | ICD-10-CM | POA: Diagnosis not present

## 2021-08-18 DIAGNOSIS — E7800 Pure hypercholesterolemia, unspecified: Secondary | ICD-10-CM | POA: Diagnosis not present

## 2021-08-18 DIAGNOSIS — N1831 Chronic kidney disease, stage 3a: Secondary | ICD-10-CM | POA: Diagnosis not present

## 2021-08-18 DIAGNOSIS — I509 Heart failure, unspecified: Secondary | ICD-10-CM | POA: Diagnosis not present

## 2021-09-14 DIAGNOSIS — K2970 Gastritis, unspecified, without bleeding: Secondary | ICD-10-CM | POA: Diagnosis not present

## 2021-09-14 DIAGNOSIS — N1831 Chronic kidney disease, stage 3a: Secondary | ICD-10-CM | POA: Diagnosis not present

## 2021-09-14 DIAGNOSIS — G629 Polyneuropathy, unspecified: Secondary | ICD-10-CM | POA: Diagnosis not present

## 2021-09-14 DIAGNOSIS — R609 Edema, unspecified: Secondary | ICD-10-CM | POA: Diagnosis not present

## 2021-09-14 DIAGNOSIS — E7800 Pure hypercholesterolemia, unspecified: Secondary | ICD-10-CM | POA: Diagnosis not present

## 2021-09-14 DIAGNOSIS — K5900 Constipation, unspecified: Secondary | ICD-10-CM | POA: Diagnosis not present

## 2021-09-14 DIAGNOSIS — M542 Cervicalgia: Secondary | ICD-10-CM | POA: Diagnosis not present

## 2021-09-14 DIAGNOSIS — Z1389 Encounter for screening for other disorder: Secondary | ICD-10-CM | POA: Diagnosis not present

## 2021-09-14 DIAGNOSIS — I129 Hypertensive chronic kidney disease with stage 1 through stage 4 chronic kidney disease, or unspecified chronic kidney disease: Secondary | ICD-10-CM | POA: Diagnosis not present

## 2021-09-14 DIAGNOSIS — K219 Gastro-esophageal reflux disease without esophagitis: Secondary | ICD-10-CM | POA: Diagnosis not present

## 2021-09-14 DIAGNOSIS — Z0000 Encounter for general adult medical examination without abnormal findings: Secondary | ICD-10-CM | POA: Diagnosis not present

## 2021-09-14 DIAGNOSIS — I2510 Atherosclerotic heart disease of native coronary artery without angina pectoris: Secondary | ICD-10-CM | POA: Diagnosis not present

## 2021-09-22 DIAGNOSIS — I10 Essential (primary) hypertension: Secondary | ICD-10-CM | POA: Diagnosis not present

## 2021-10-11 DIAGNOSIS — N1831 Chronic kidney disease, stage 3a: Secondary | ICD-10-CM | POA: Diagnosis not present

## 2021-11-09 DIAGNOSIS — N1832 Chronic kidney disease, stage 3b: Secondary | ICD-10-CM | POA: Diagnosis not present

## 2021-11-30 DIAGNOSIS — N1832 Chronic kidney disease, stage 3b: Secondary | ICD-10-CM | POA: Diagnosis not present

## 2021-11-30 DIAGNOSIS — E7800 Pure hypercholesterolemia, unspecified: Secondary | ICD-10-CM | POA: Diagnosis not present

## 2021-12-31 ENCOUNTER — Encounter (HOSPITAL_BASED_OUTPATIENT_CLINIC_OR_DEPARTMENT_OTHER): Payer: Self-pay | Admitting: Emergency Medicine

## 2021-12-31 ENCOUNTER — Emergency Department (HOSPITAL_BASED_OUTPATIENT_CLINIC_OR_DEPARTMENT_OTHER): Payer: Medicare Other | Admitting: Radiology

## 2021-12-31 ENCOUNTER — Emergency Department (HOSPITAL_BASED_OUTPATIENT_CLINIC_OR_DEPARTMENT_OTHER)
Admission: EM | Admit: 2021-12-31 | Discharge: 2021-12-31 | Disposition: A | Discharge status: Home / Self Care | Payer: Medicare Other | Attending: Emergency Medicine | Admitting: Emergency Medicine

## 2021-12-31 ENCOUNTER — Emergency Department (HOSPITAL_BASED_OUTPATIENT_CLINIC_OR_DEPARTMENT_OTHER): Payer: Medicare Other

## 2021-12-31 DIAGNOSIS — I509 Heart failure, unspecified: Secondary | ICD-10-CM | POA: Diagnosis not present

## 2021-12-31 DIAGNOSIS — R1013 Epigastric pain: Secondary | ICD-10-CM

## 2021-12-31 DIAGNOSIS — K5901 Slow transit constipation: Secondary | ICD-10-CM

## 2021-12-31 DIAGNOSIS — R001 Bradycardia, unspecified: Secondary | ICD-10-CM | POA: Diagnosis not present

## 2021-12-31 DIAGNOSIS — J449 Chronic obstructive pulmonary disease, unspecified: Secondary | ICD-10-CM | POA: Diagnosis not present

## 2021-12-31 DIAGNOSIS — K5730 Diverticulosis of large intestine without perforation or abscess without bleeding: Secondary | ICD-10-CM | POA: Diagnosis not present

## 2021-12-31 DIAGNOSIS — K5900 Constipation, unspecified: Secondary | ICD-10-CM | POA: Diagnosis not present

## 2021-12-31 DIAGNOSIS — R0602 Shortness of breath: Secondary | ICD-10-CM | POA: Diagnosis not present

## 2021-12-31 LAB — CBC WITH DIFFERENTIAL/PLATELET
Abs Immature Granulocytes: 0.01 10*3/uL (ref 0.00–0.07)
Basophils Absolute: 0 10*3/uL (ref 0.0–0.1)
Basophils Relative: 1 %
Eosinophils Absolute: 0.1 10*3/uL (ref 0.0–0.5)
Eosinophils Relative: 2 %
HCT: 36.7 % — ABNORMAL LOW (ref 39.0–52.0)
Hemoglobin: 12 g/dL — ABNORMAL LOW (ref 13.0–17.0)
Immature Granulocytes: 0 %
Lymphocytes Relative: 30 %
Lymphs Abs: 1.5 10*3/uL (ref 0.7–4.0)
MCH: 30.5 pg (ref 26.0–34.0)
MCHC: 32.7 g/dL (ref 30.0–36.0)
MCV: 93.4 fL (ref 80.0–100.0)
Monocytes Absolute: 0.5 10*3/uL (ref 0.1–1.0)
Monocytes Relative: 10 %
Neutro Abs: 2.7 10*3/uL (ref 1.7–7.7)
Neutrophils Relative %: 57 %
Platelets: 173 10*3/uL (ref 150–400)
RBC: 3.93 MIL/uL — ABNORMAL LOW (ref 4.22–5.81)
RDW: 12.5 % (ref 11.5–15.5)
WBC: 4.8 10*3/uL (ref 4.0–10.5)
nRBC: 0 % (ref 0.0–0.2)

## 2021-12-31 LAB — COMPREHENSIVE METABOLIC PANEL
ALT: 12 U/L (ref 0–44)
AST: 13 U/L — ABNORMAL LOW (ref 15–41)
Albumin: 4.3 g/dL (ref 3.5–5.0)
Alkaline Phosphatase: 56 U/L (ref 38–126)
Anion gap: 5 (ref 5–15)
BUN: 27 mg/dL — ABNORMAL HIGH (ref 8–23)
CO2: 33 mmol/L — ABNORMAL HIGH (ref 22–32)
Calcium: 9.6 mg/dL (ref 8.9–10.3)
Chloride: 97 mmol/L — ABNORMAL LOW (ref 98–111)
Creatinine, Ser: 1.69 mg/dL — ABNORMAL HIGH (ref 0.61–1.24)
GFR, Estimated: 38 mL/min — ABNORMAL LOW (ref >60)
Glucose, Bld: 103 mg/dL — ABNORMAL HIGH (ref 70–99)
Potassium: 4.3 mmol/L (ref 3.5–5.1)
Sodium: 135 mmol/L (ref 135–145)
Total Bilirubin: 0.6 mg/dL (ref 0.3–1.2)
Total Protein: 6.6 g/dL (ref 6.5–8.1)

## 2021-12-31 LAB — TROPONIN I (HIGH SENSITIVITY): Troponin I (High Sensitivity): 11 ng/L (ref <18)

## 2021-12-31 LAB — LIPASE, BLOOD: Lipase: 52 U/L — ABNORMAL HIGH (ref 11–51)

## 2021-12-31 LAB — BRAIN NATRIURETIC PEPTIDE: B Natriuretic Peptide: 257.7 pg/mL — ABNORMAL HIGH (ref 0.0–100.0)

## 2021-12-31 MED ORDER — ALBUTEROL SULFATE HFA 108 (90 BASE) MCG/ACT IN AERS
2.0000 | INHALATION_SPRAY | RESPIRATORY_TRACT | Status: DC | PRN
  Administered 2021-12-31: 2 via RESPIRATORY_TRACT
  Filled 2021-12-31: qty 6.7

## 2022-01-04 DIAGNOSIS — I10 Essential (primary) hypertension: Secondary | ICD-10-CM | POA: Diagnosis not present

## 2022-01-04 DIAGNOSIS — K5909 Other constipation: Secondary | ICD-10-CM | POA: Diagnosis not present

## 2022-01-11 DIAGNOSIS — H18593 Other hereditary corneal dystrophies, bilateral: Secondary | ICD-10-CM | POA: Diagnosis not present

## 2022-01-11 DIAGNOSIS — H04123 Dry eye syndrome of bilateral lacrimal glands: Secondary | ICD-10-CM | POA: Diagnosis not present

## 2022-01-11 DIAGNOSIS — H401131 Primary open-angle glaucoma, bilateral, mild stage: Secondary | ICD-10-CM | POA: Diagnosis not present

## 2022-02-01 DIAGNOSIS — I2510 Atherosclerotic heart disease of native coronary artery without angina pectoris: Secondary | ICD-10-CM | POA: Diagnosis not present

## 2022-02-01 DIAGNOSIS — I129 Hypertensive chronic kidney disease with stage 1 through stage 4 chronic kidney disease, or unspecified chronic kidney disease: Secondary | ICD-10-CM | POA: Diagnosis not present

## 2022-02-01 DIAGNOSIS — I10 Essential (primary) hypertension: Secondary | ICD-10-CM | POA: Diagnosis not present

## 2022-02-01 DIAGNOSIS — N1831 Chronic kidney disease, stage 3a: Secondary | ICD-10-CM | POA: Diagnosis not present

## 2022-02-01 DIAGNOSIS — E7800 Pure hypercholesterolemia, unspecified: Secondary | ICD-10-CM | POA: Diagnosis not present

## 2022-02-23 ENCOUNTER — Ambulatory Visit (INDEPENDENT_AMBULATORY_CARE_PROVIDER_SITE_OTHER): Payer: Medicare Other | Admitting: Physician Assistant

## 2022-02-23 VITALS — BP 102/52 | HR 49 | Ht 68.0 in | Wt 169.0 lb

## 2022-02-23 DIAGNOSIS — I10 Essential (primary) hypertension: Secondary | ICD-10-CM | POA: Diagnosis not present

## 2022-02-23 DIAGNOSIS — E782 Mixed hyperlipidemia: Secondary | ICD-10-CM

## 2022-02-23 DIAGNOSIS — I2510 Atherosclerotic heart disease of native coronary artery without angina pectoris: Secondary | ICD-10-CM | POA: Diagnosis not present

## 2022-02-23 MED ORDER — FUROSEMIDE 20 MG PO TABS: 20.0000 mg | ORAL_TABLET | Freq: Every day | ORAL | 3 refills | Status: DC

## 2022-02-23 NOTE — Progress Notes (Signed)
Cardiology Office Note:    Date:  02/23/2022   ID:  [name], DOB Mar 12, 1929, MRN [id number]  PCP:  Maury Dus, MD  [facility] Cardiologist:  Candee Furbish, MD  [facility] Electrophysiologist:  None   Chief Complaint: 2 yr follow up   History of Present Illness:    [name] is a 88 y.o. male with a hx of CAD, HTN and HLD seen for follow up.   Hx of CAD with stenting in 2001. Low risk stress test in 2017.  Here today for follow up.  Patient is not taking his aspirin and Crestor for many years.  He has noted intermittent lower extremity edema.  Has Lasix to take as needed.  Also reported dizziness while outside in hot weather.  No orthostatics type symptoms.  Denies chest pain, shortness of breath, orthopnea, PND, syncope or melena.  Past Medical History:  Diagnosis Date   CAD (coronary artery disease)    Hypercholesteremia    Hyperlipidemia    Hypertension    Spleen injury     Past Surgical History:  Procedure Laterality Date   CHOLECYSTECTOMY     CORONARY STENT PLACEMENT     THYROID SURGERY     growth removed    Current Medications: Current Meds  Medication Sig   docusate sodium (COLACE) 100 MG capsule Take 100 mg by mouth daily as needed for mild constipation.   doxazosin (CARDURA) 4 MG tablet Take 4 mg by mouth daily.   furosemide (LASIX) 20 MG tablet Take 1 tablet (20 mg total) by mouth daily.   hydrochlorothiazide (HYDRODIURIL) 25 MG tablet Take 1 tablet by mouth daily.   latanoprost (XALATAN) 0.005 % ophthalmic solution Place 1 drop into both eyes at bedtime.    losartan (COZAAR) 50 MG tablet Take 50 mg by mouth daily.   metoprolol tartrate (LOPRESSOR) 50 MG tablet Take 50 mg by mouth 2 (two) times daily.   polyethylene glycol (MIRALAX / GLYCOLAX) 17 g packet Take 17 g by mouth 2 (two) times daily.   [DISCONTINUED] furosemide (LASIX) 40 MG tablet Take 1 tablet (40 mg total) by mouth daily as needed.     Allergies:   Morphine and  related and Avelox [moxifloxacin hcl in nacl]   Social History   Socioeconomic History   Marital status: Married    Spouse name: Not on file   Number of children: Not on file   Years of education: Not on file   Highest education level: Not on file  Occupational History   Not on file  Tobacco Use   Smoking status: Never   Smokeless tobacco: Never  Substance and Sexual Activity   Alcohol use: No   Drug use: No   Sexual activity: Not on file  Other Topics Concern   Not on file  Social History Narrative   Not on file   Social Determinants of Health   Financial Resource Strain: Not on file  Food Insecurity: Not on file  Transportation Needs: Not on file  Physical Activity: Not on file  Stress: Not on file  Social Connections: Not on file     Family History: The patient's family history includes Heart attack in his father; Heart disease in his father.    ROS:   Please see the history of present illness.    All other systems reviewed and are negative.   EKGs/Labs/Other Studies Reviewed:    The following studies were reviewed today: As summarized above  EKG:  EKG is not  ordered today.  Recent Labs: 12/31/2021: ALT 12; B Natriuretic Peptide 257.7; BUN 27; Creatinine, Ser 1.69; Hemoglobin 12.0; Platelets 173; Potassium 4.3; Sodium 135  Recent Lipid Panel    Component Value Date/Time   CHOL  04/05/2007 0310    80        ATP III CLASSIFICATION:  <200     mg/dL   Desirable  200-239  mg/dL   Borderline High  >=240    mg/dL   High   TRIG 87 04/05/2007 0310   HDL 29 (L) 04/05/2007 0310   CHOLHDL 2.8 04/05/2007 0310   VLDL 17 04/05/2007 0310   LDLCALC  04/05/2007 0310    34        Total Cholesterol/HDL:CHD Risk Coronary Heart Disease Risk Table                     Men   Women  1/2 Average Risk   3.4   3.3     Physical Exam:    VS:  BP (!) 102/52   Pulse (!) 49   Ht '5\' 8"'$  (1.727 m)   Wt 169 lb (76.7 kg)   SpO2 95%   BMI 25.70 kg/m     Wt Readings from  Last 3 Encounters:  02/23/22 169 lb (76.7 kg)  03/30/21 170 lb (77.1 kg)  11/07/19 177 lb (80.3 kg)     GEN:  Well nourished, well developed in no acute distress HEENT: Normal NECK: No JVD; No carotid bruits LYMPHATICS: No lymphadenopathy CARDIAC: RRR, no murmurs, rubs, gallops RESPIRATORY:  Clear to auscultation without rales, wheezing or rhonchi  ABDOMEN: Soft, non-tender, non-distended MUSCULOSKELETAL:  No edema; No deformity  SKIN: Warm and dry NEUROLOGIC:  Alert and oriented x 3 PSYCHIATRIC:  Normal affect   ASSESSMENT AND PLAN:    CAD s/p remote stenting No angina.  He is taken off aspirin and statin by PCP.  Continue beta-blocker.  2.  Lower extremity edema -Strongly recommended leg elevation, compression stocking and low-sodium diet.  Only take Lasix as needed but cautious with dehydration and fall.  At length discussion.  3.  Hypertension -Blood pressure control on Diovan/HCT and metoprolol. -Blood pressure soft low.  Advised to keep log and follow-up with PCP.  Medication Adjustments/Labs and Tests Ordered: Current medicines are reviewed at length with the patient today.  Concerns regarding medicines are outlined above.  No orders of the defined types were placed in this encounter.  Meds ordered this encounter  Medications   furosemide (LASIX) 20 MG tablet    Sig: Take 1 tablet (20 mg total) by mouth daily.    Dispense:  45 tablet    Refill:  3    Patient Instructions  Medication Instructions:  Your physician has recommended you make the following change in your medication: 1) Lasix (furosemide) 20 mg daily as needed for swelling or weight gain  *If you need a refill on your cardiac medications before your next appointment, please call your pharmacy*  Follow-Up: At [facility], you and your health needs are our priority.  As part of our continuing mission to provide you with exceptional heart care, we have created designated Provider Care Teams.  These  Care Teams include your primary Cardiologist (physician) and Advanced Practice Providers (APPs -  Physician Assistants and Nurse Practitioners) who all work together to provide you with the care you need, when you need it.  Your next appointment:   1 year(s)  The  format for your next appointment:   In Person  Provider:   Candee Furbish, MD    Important Information About Sugar         Signed, Leanor Kail, [place]  02/23/2022 2:20 PM    [place]

## 2022-02-23 NOTE — Patient Instructions (Addendum)
Medication Instructions:  Your physician has recommended you make the following change in your medication: 1) Lasix (furosemide) 20 mg daily as needed for swelling or weight gain  *If you need a refill on your cardiac medications before your next appointment, please call your pharmacy*  Follow-Up: At [facility], you and your health needs are our priority.  As part of our continuing mission to provide you with exceptional heart care, we have created designated Provider Care Teams.  These Care Teams include your primary Cardiologist (physician) and Advanced Practice Providers (APPs -  Physician Assistants and Nurse Practitioners) who all work together to provide you with the care you need, when you need it.  Your next appointment:   1 year(s)  The format for your next appointment:   In Person  Provider:   Candee Furbish, MD    Important Information About Sugar

## 2022-03-01 DIAGNOSIS — N1831 Chronic kidney disease, stage 3a: Secondary | ICD-10-CM | POA: Diagnosis not present

## 2022-03-01 DIAGNOSIS — I10 Essential (primary) hypertension: Secondary | ICD-10-CM | POA: Diagnosis not present

## 2022-03-01 DIAGNOSIS — I2510 Atherosclerotic heart disease of native coronary artery without angina pectoris: Secondary | ICD-10-CM | POA: Diagnosis not present

## 2022-03-01 DIAGNOSIS — E7800 Pure hypercholesterolemia, unspecified: Secondary | ICD-10-CM | POA: Diagnosis not present

## 2022-04-06 DIAGNOSIS — Z23 Encounter for immunization: Secondary | ICD-10-CM | POA: Diagnosis not present

## 2022-04-06 DIAGNOSIS — I2510 Atherosclerotic heart disease of native coronary artery without angina pectoris: Secondary | ICD-10-CM | POA: Diagnosis not present

## 2022-04-06 DIAGNOSIS — D649 Anemia, unspecified: Secondary | ICD-10-CM | POA: Diagnosis not present

## 2022-04-06 DIAGNOSIS — R141 Gas pain: Secondary | ICD-10-CM | POA: Diagnosis not present

## 2022-04-06 DIAGNOSIS — K219 Gastro-esophageal reflux disease without esophagitis: Secondary | ICD-10-CM | POA: Diagnosis not present

## 2022-04-06 DIAGNOSIS — G629 Polyneuropathy, unspecified: Secondary | ICD-10-CM | POA: Diagnosis not present

## 2022-04-06 DIAGNOSIS — R609 Edema, unspecified: Secondary | ICD-10-CM | POA: Diagnosis not present

## 2022-04-06 DIAGNOSIS — N1831 Chronic kidney disease, stage 3a: Secondary | ICD-10-CM | POA: Diagnosis not present

## 2022-04-06 DIAGNOSIS — I129 Hypertensive chronic kidney disease with stage 1 through stage 4 chronic kidney disease, or unspecified chronic kidney disease: Secondary | ICD-10-CM | POA: Diagnosis not present

## 2022-04-06 DIAGNOSIS — E7800 Pure hypercholesterolemia, unspecified: Secondary | ICD-10-CM | POA: Diagnosis not present

## 2022-04-06 DIAGNOSIS — K5900 Constipation, unspecified: Secondary | ICD-10-CM | POA: Diagnosis not present

## 2022-04-06 DIAGNOSIS — K2970 Gastritis, unspecified, without bleeding: Secondary | ICD-10-CM | POA: Diagnosis not present

## 2022-04-24 DIAGNOSIS — N1831 Chronic kidney disease, stage 3a: Secondary | ICD-10-CM | POA: Diagnosis not present

## 2022-05-15 DIAGNOSIS — D649 Anemia, unspecified: Secondary | ICD-10-CM | POA: Diagnosis not present

## 2022-05-24 DIAGNOSIS — E7800 Pure hypercholesterolemia, unspecified: Secondary | ICD-10-CM | POA: Diagnosis not present

## 2022-05-24 DIAGNOSIS — I2510 Atherosclerotic heart disease of native coronary artery without angina pectoris: Secondary | ICD-10-CM | POA: Diagnosis not present

## 2022-05-24 DIAGNOSIS — K219 Gastro-esophageal reflux disease without esophagitis: Secondary | ICD-10-CM | POA: Diagnosis not present

## 2022-05-24 DIAGNOSIS — I10 Essential (primary) hypertension: Secondary | ICD-10-CM | POA: Diagnosis not present

## 2022-05-24 DIAGNOSIS — I509 Heart failure, unspecified: Secondary | ICD-10-CM | POA: Diagnosis not present

## 2022-05-24 DIAGNOSIS — N1831 Chronic kidney disease, stage 3a: Secondary | ICD-10-CM | POA: Diagnosis not present

## 2022-05-24 DIAGNOSIS — I129 Hypertensive chronic kidney disease with stage 1 through stage 4 chronic kidney disease, or unspecified chronic kidney disease: Secondary | ICD-10-CM | POA: Diagnosis not present

## 2022-07-19 DIAGNOSIS — H353134 Nonexudative age-related macular degeneration, bilateral, advanced atrophic with subfoveal involvement: Secondary | ICD-10-CM | POA: Diagnosis not present

## 2022-07-19 DIAGNOSIS — H04123 Dry eye syndrome of bilateral lacrimal glands: Secondary | ICD-10-CM | POA: Diagnosis not present

## 2022-07-19 DIAGNOSIS — H18593 Other hereditary corneal dystrophies, bilateral: Secondary | ICD-10-CM | POA: Diagnosis not present

## 2022-07-19 DIAGNOSIS — H52203 Unspecified astigmatism, bilateral: Secondary | ICD-10-CM | POA: Diagnosis not present

## 2022-07-19 DIAGNOSIS — H524 Presbyopia: Secondary | ICD-10-CM | POA: Diagnosis not present

## 2022-07-19 DIAGNOSIS — H401131 Primary open-angle glaucoma, bilateral, mild stage: Secondary | ICD-10-CM | POA: Diagnosis not present

## 2022-08-31 DIAGNOSIS — N1831 Chronic kidney disease, stage 3a: Secondary | ICD-10-CM | POA: Diagnosis not present

## 2022-08-31 DIAGNOSIS — I509 Heart failure, unspecified: Secondary | ICD-10-CM | POA: Diagnosis not present

## 2022-08-31 DIAGNOSIS — E7800 Pure hypercholesterolemia, unspecified: Secondary | ICD-10-CM | POA: Diagnosis not present

## 2022-08-31 DIAGNOSIS — I2510 Atherosclerotic heart disease of native coronary artery without angina pectoris: Secondary | ICD-10-CM | POA: Diagnosis not present

## 2022-08-31 DIAGNOSIS — I129 Hypertensive chronic kidney disease with stage 1 through stage 4 chronic kidney disease, or unspecified chronic kidney disease: Secondary | ICD-10-CM | POA: Diagnosis not present

## 2022-08-31 DIAGNOSIS — I10 Essential (primary) hypertension: Secondary | ICD-10-CM | POA: Diagnosis not present

## 2022-10-30 DIAGNOSIS — H6123 Impacted cerumen, bilateral: Secondary | ICD-10-CM | POA: Diagnosis not present

## 2022-10-30 DIAGNOSIS — G8929 Other chronic pain: Secondary | ICD-10-CM | POA: Diagnosis not present

## 2022-10-30 DIAGNOSIS — I2510 Atherosclerotic heart disease of native coronary artery without angina pectoris: Secondary | ICD-10-CM | POA: Diagnosis not present

## 2022-10-30 DIAGNOSIS — N1832 Chronic kidney disease, stage 3b: Secondary | ICD-10-CM | POA: Diagnosis not present

## 2022-10-30 DIAGNOSIS — G629 Polyneuropathy, unspecified: Secondary | ICD-10-CM | POA: Diagnosis not present

## 2022-10-30 DIAGNOSIS — D649 Anemia, unspecified: Secondary | ICD-10-CM | POA: Diagnosis not present

## 2022-10-30 DIAGNOSIS — E7800 Pure hypercholesterolemia, unspecified: Secondary | ICD-10-CM | POA: Diagnosis not present

## 2022-10-30 DIAGNOSIS — K5909 Other constipation: Secondary | ICD-10-CM | POA: Diagnosis not present

## 2022-10-30 DIAGNOSIS — M542 Cervicalgia: Secondary | ICD-10-CM | POA: Diagnosis not present

## 2022-10-30 DIAGNOSIS — Z0000 Encounter for general adult medical examination without abnormal findings: Secondary | ICD-10-CM | POA: Diagnosis not present

## 2022-10-30 DIAGNOSIS — I10 Essential (primary) hypertension: Secondary | ICD-10-CM | POA: Diagnosis not present

## 2022-10-30 DIAGNOSIS — Z789 Other specified health status: Secondary | ICD-10-CM | POA: Diagnosis not present

## 2023-02-01 DIAGNOSIS — R0982 Postnasal drip: Secondary | ICD-10-CM | POA: Diagnosis not present

## 2023-02-01 DIAGNOSIS — I10 Essential (primary) hypertension: Secondary | ICD-10-CM | POA: Diagnosis not present

## 2023-02-01 DIAGNOSIS — R001 Bradycardia, unspecified: Secondary | ICD-10-CM | POA: Diagnosis not present

## 2023-02-01 DIAGNOSIS — K118 Other diseases of salivary glands: Secondary | ICD-10-CM | POA: Diagnosis not present

## 2023-02-01 DIAGNOSIS — N1831 Chronic kidney disease, stage 3a: Secondary | ICD-10-CM | POA: Diagnosis not present

## 2023-03-07 DIAGNOSIS — H04123 Dry eye syndrome of bilateral lacrimal glands: Secondary | ICD-10-CM | POA: Diagnosis not present

## 2023-03-07 DIAGNOSIS — H353132 Nonexudative age-related macular degeneration, bilateral, intermediate dry stage: Secondary | ICD-10-CM | POA: Diagnosis not present

## 2023-03-07 DIAGNOSIS — H401131 Primary open-angle glaucoma, bilateral, mild stage: Secondary | ICD-10-CM | POA: Diagnosis not present

## 2023-03-07 DIAGNOSIS — H18593 Other hereditary corneal dystrophies, bilateral: Secondary | ICD-10-CM | POA: Diagnosis not present

## 2023-04-02 DIAGNOSIS — Z23 Encounter for immunization: Secondary | ICD-10-CM | POA: Diagnosis not present

## 2023-08-03 DIAGNOSIS — Z7189 Other specified counseling: Secondary | ICD-10-CM | POA: Diagnosis not present

## 2023-08-03 DIAGNOSIS — K5909 Other constipation: Secondary | ICD-10-CM | POA: Diagnosis not present

## 2023-08-03 DIAGNOSIS — I10 Essential (primary) hypertension: Secondary | ICD-10-CM | POA: Diagnosis not present

## 2023-08-03 DIAGNOSIS — R001 Bradycardia, unspecified: Secondary | ICD-10-CM | POA: Diagnosis not present

## 2023-11-13 DIAGNOSIS — I2510 Atherosclerotic heart disease of native coronary artery without angina pectoris: Secondary | ICD-10-CM | POA: Diagnosis not present

## 2023-11-13 DIAGNOSIS — Z1331 Encounter for screening for depression: Secondary | ICD-10-CM | POA: Diagnosis not present

## 2023-11-13 DIAGNOSIS — M542 Cervicalgia: Secondary | ICD-10-CM | POA: Diagnosis not present

## 2023-11-13 DIAGNOSIS — Z0000 Encounter for general adult medical examination without abnormal findings: Secondary | ICD-10-CM | POA: Diagnosis not present

## 2023-11-13 DIAGNOSIS — Z23 Encounter for immunization: Secondary | ICD-10-CM | POA: Diagnosis not present

## 2023-11-13 DIAGNOSIS — I10 Essential (primary) hypertension: Secondary | ICD-10-CM | POA: Diagnosis not present

## 2023-11-13 DIAGNOSIS — E7800 Pure hypercholesterolemia, unspecified: Secondary | ICD-10-CM | POA: Diagnosis not present

## 2023-11-13 DIAGNOSIS — N1831 Chronic kidney disease, stage 3a: Secondary | ICD-10-CM | POA: Diagnosis not present

## 2023-11-23 DIAGNOSIS — M542 Cervicalgia: Secondary | ICD-10-CM | POA: Diagnosis not present

## 2024-02-21 DIAGNOSIS — R0981 Nasal congestion: Secondary | ICD-10-CM | POA: Diagnosis not present

## 2024-02-21 DIAGNOSIS — R1310 Dysphagia, unspecified: Secondary | ICD-10-CM | POA: Diagnosis not present

## 2024-02-21 DIAGNOSIS — K219 Gastro-esophageal reflux disease without esophagitis: Secondary | ICD-10-CM | POA: Diagnosis not present

## 2024-05-01 DIAGNOSIS — Z23 Encounter for immunization: Secondary | ICD-10-CM | POA: Diagnosis not present

## 2024-07-14 ENCOUNTER — Telehealth: Payer: Self-pay | Admitting: Cardiology

## 2024-07-14 NOTE — Telephone Encounter (Signed)
" °*  STAT* If patient is at the pharmacy, call can be transferred to refill team.   1. Which medications need to be refilled? (please list name of each medication and dose if known) furosemide  (LASIX ) 20 MG tablet    2. Would you like to learn more about the convenience, safety, & potential cost savings by using the [facility]?     3. Are you open to using the [facility] (Type [facility]. ).   4. Which pharmacy/location (including street and city if local pharmacy) is medication to be sent to? [id number] - [place],  - [address] AT [address] [facility] [address]    5. Do they need a 30 day or 90 day supply? 30 day  "

## 2024-07-16 MED ORDER — FUROSEMIDE 20 MG PO TABS: 20.0000 mg | ORAL_TABLET | Freq: Every day | ORAL | 0 refills | Status: DC

## 2024-07-16 NOTE — Telephone Encounter (Signed)
 Pt's medication was sent to pt's pharmacy as requested. Confirmation received.

## 2024-07-21 ENCOUNTER — Encounter (HOSPITAL_COMMUNITY): Payer: Self-pay | Admitting: *Deleted

## 2024-07-21 ENCOUNTER — Other Ambulatory Visit: Payer: Self-pay

## 2024-07-21 ENCOUNTER — Emergency Department (HOSPITAL_COMMUNITY)

## 2024-07-21 ENCOUNTER — Inpatient Hospital Stay (HOSPITAL_COMMUNITY)
Admission: EM | Admit: 2024-07-21 | Discharge: 2024-07-28 | DRG: 193 | Disposition: A | Discharge status: Home Health | Attending: Internal Medicine | Admitting: Internal Medicine

## 2024-07-21 DIAGNOSIS — I2510 Atherosclerotic heart disease of native coronary artery without angina pectoris: Secondary | ICD-10-CM | POA: Diagnosis present

## 2024-07-21 DIAGNOSIS — Z66 Do not resuscitate: Secondary | ICD-10-CM | POA: Diagnosis present

## 2024-07-21 DIAGNOSIS — N1832 Chronic kidney disease, stage 3b: Secondary | ICD-10-CM

## 2024-07-21 DIAGNOSIS — I130 Hypertensive heart and chronic kidney disease with heart failure and stage 1 through stage 4 chronic kidney disease, or unspecified chronic kidney disease: Secondary | ICD-10-CM | POA: Diagnosis present

## 2024-07-21 DIAGNOSIS — R0602 Shortness of breath: Principal | ICD-10-CM

## 2024-07-21 DIAGNOSIS — J9601 Acute respiratory failure with hypoxia: Secondary | ICD-10-CM | POA: Diagnosis present

## 2024-07-21 DIAGNOSIS — J208 Acute bronchitis due to other specified organisms: Secondary | ICD-10-CM | POA: Diagnosis present

## 2024-07-21 DIAGNOSIS — Z79899 Other long term (current) drug therapy: Secondary | ICD-10-CM | POA: Diagnosis not present

## 2024-07-21 DIAGNOSIS — J101 Influenza due to other identified influenza virus with other respiratory manifestations: Secondary | ICD-10-CM | POA: Diagnosis not present

## 2024-07-21 DIAGNOSIS — E7800 Pure hypercholesterolemia, unspecified: Secondary | ICD-10-CM | POA: Diagnosis present

## 2024-07-21 DIAGNOSIS — Z1152 Encounter for screening for COVID-19: Secondary | ICD-10-CM | POA: Diagnosis not present

## 2024-07-21 DIAGNOSIS — R1314 Dysphagia, pharyngoesophageal phase: Secondary | ICD-10-CM | POA: Diagnosis present

## 2024-07-21 DIAGNOSIS — Z8249 Family history of ischemic heart disease and other diseases of the circulatory system: Secondary | ICD-10-CM

## 2024-07-21 DIAGNOSIS — Z885 Allergy status to narcotic agent status: Secondary | ICD-10-CM

## 2024-07-21 DIAGNOSIS — K5900 Constipation, unspecified: Secondary | ICD-10-CM | POA: Diagnosis present

## 2024-07-21 DIAGNOSIS — Z23 Encounter for immunization: Secondary | ICD-10-CM | POA: Diagnosis present

## 2024-07-21 DIAGNOSIS — I5033 Acute on chronic diastolic (congestive) heart failure: Secondary | ICD-10-CM | POA: Diagnosis present

## 2024-07-21 DIAGNOSIS — J1000 Influenza due to other identified influenza virus with unspecified type of pneumonia: Principal | ICD-10-CM | POA: Diagnosis present

## 2024-07-21 DIAGNOSIS — D631 Anemia in chronic kidney disease: Secondary | ICD-10-CM | POA: Diagnosis present

## 2024-07-21 DIAGNOSIS — R6889 Other general symptoms and signs: Secondary | ICD-10-CM | POA: Diagnosis not present

## 2024-07-21 DIAGNOSIS — Z955 Presence of coronary angioplasty implant and graft: Secondary | ICD-10-CM

## 2024-07-21 DIAGNOSIS — I10 Essential (primary) hypertension: Secondary | ICD-10-CM | POA: Diagnosis not present

## 2024-07-21 DIAGNOSIS — R0609 Other forms of dyspnea: Secondary | ICD-10-CM | POA: Diagnosis not present

## 2024-07-21 DIAGNOSIS — J690 Pneumonitis due to inhalation of food and vomit: Secondary | ICD-10-CM | POA: Diagnosis present

## 2024-07-21 DIAGNOSIS — Z881 Allergy status to other antibiotic agents status: Secondary | ICD-10-CM

## 2024-07-21 LAB — I-STAT VENOUS BLOOD GAS, ED
Acid-Base Excess: 3 mmol/L — ABNORMAL HIGH (ref 0.0–2.0)
Bicarbonate: 29.2 mmol/L — ABNORMAL HIGH (ref 20.0–28.0)
Calcium, Ion: 1.14 mmol/L — ABNORMAL LOW (ref 1.15–1.40)
HCT: 34 % — ABNORMAL LOW (ref 39.0–52.0)
Hemoglobin: 11.6 g/dL — ABNORMAL LOW (ref 13.0–17.0)
O2 Saturation: 50 %
Potassium: 4.2 mmol/L (ref 3.5–5.1)
Sodium: 139 mmol/L (ref 135–145)
TCO2: 31 mmol/L (ref 22–32)
pCO2, Ven: 53.1 mmHg (ref 44–60)
pH, Ven: 7.348 (ref 7.25–7.43)
pO2, Ven: 29 mmHg — CL (ref 32–45)

## 2024-07-21 LAB — URINALYSIS, ROUTINE W REFLEX MICROSCOPIC
Bilirubin Urine: NEGATIVE
Glucose, UA: NEGATIVE mg/dL
Ketones, ur: NEGATIVE mg/dL
Leukocytes,Ua: NEGATIVE
Nitrite: NEGATIVE
Protein, ur: NEGATIVE mg/dL
Specific Gravity, Urine: 1.012 (ref 1.005–1.030)
pH: 5 (ref 5.0–8.0)

## 2024-07-21 LAB — COMPREHENSIVE METABOLIC PANEL WITH GFR
ALT: 17 U/L (ref 0–44)
AST: 21 U/L (ref 15–41)
Albumin: 4 g/dL (ref 3.5–5.0)
Alkaline Phosphatase: 100 U/L (ref 38–126)
Anion gap: 12 (ref 5–15)
BUN: 38 mg/dL — ABNORMAL HIGH (ref 8–23)
CO2: 27 mmol/L (ref 22–32)
Calcium: 8.8 mg/dL — ABNORMAL LOW (ref 8.9–10.3)
Chloride: 99 mmol/L (ref 98–111)
Creatinine, Ser: 1.37 mg/dL — ABNORMAL HIGH (ref 0.61–1.24)
GFR, Estimated: 48 mL/min — ABNORMAL LOW
Glucose, Bld: 109 mg/dL — ABNORMAL HIGH (ref 70–99)
Potassium: 3.9 mmol/L (ref 3.5–5.1)
Sodium: 138 mmol/L (ref 135–145)
Total Bilirubin: 0.6 mg/dL (ref 0.0–1.2)
Total Protein: 6.8 g/dL (ref 6.5–8.1)

## 2024-07-21 LAB — CBC WITH DIFFERENTIAL/PLATELET
Abs Immature Granulocytes: 0.05 K/uL (ref 0.00–0.07)
Basophils Absolute: 0 K/uL (ref 0.0–0.1)
Basophils Relative: 0 %
Eosinophils Absolute: 0 K/uL (ref 0.0–0.5)
Eosinophils Relative: 0 %
HCT: 36.4 % — ABNORMAL LOW (ref 39.0–52.0)
Hemoglobin: 11.9 g/dL — ABNORMAL LOW (ref 13.0–17.0)
Immature Granulocytes: 1 %
Lymphocytes Relative: 10 %
Lymphs Abs: 0.9 K/uL (ref 0.7–4.0)
MCH: 32.1 pg (ref 26.0–34.0)
MCHC: 32.7 g/dL (ref 30.0–36.0)
MCV: 98.1 fL (ref 80.0–100.0)
Monocytes Absolute: 0.7 K/uL (ref 0.1–1.0)
Monocytes Relative: 8 %
Neutro Abs: 6.9 K/uL (ref 1.7–7.7)
Neutrophils Relative %: 81 %
Platelets: 145 K/uL — ABNORMAL LOW (ref 150–400)
RBC: 3.71 MIL/uL — ABNORMAL LOW (ref 4.22–5.81)
RDW: 13 % (ref 11.5–15.5)
WBC: 8.5 K/uL (ref 4.0–10.5)
nRBC: 0 % (ref 0.0–0.2)

## 2024-07-21 LAB — RESP PANEL BY RT-PCR (RSV, FLU A&B, COVID)  RVPGX2
Influenza A by PCR: POSITIVE — AB
Influenza B by PCR: NEGATIVE
Resp Syncytial Virus by PCR: NEGATIVE
SARS Coronavirus 2 by RT PCR: NEGATIVE

## 2024-07-21 LAB — PRO BRAIN NATRIURETIC PEPTIDE: Pro Brain Natriuretic Peptide: 1997 pg/mL — ABNORMAL HIGH

## 2024-07-21 LAB — TROPONIN T, HIGH SENSITIVITY
Troponin T High Sensitivity: 35 ng/L — ABNORMAL HIGH (ref 0–19)
Troponin T High Sensitivity: 40 ng/L — ABNORMAL HIGH (ref 0–19)
Troponin T High Sensitivity: 46 ng/L — ABNORMAL HIGH (ref 0–19)

## 2024-07-21 MED ORDER — FUROSEMIDE 10 MG/ML IJ SOLN
40.0000 mg | Freq: Once | INTRAMUSCULAR | Status: AC
  Administered 2024-07-21: 40 mg via INTRAVENOUS
  Filled 2024-07-21: qty 4

## 2024-07-21 MED ORDER — IOHEXOL 350 MG/ML SOLN
75.0000 mL | Freq: Once | INTRAVENOUS | Status: AC | PRN
  Administered 2024-07-21: 75 mL via INTRAVENOUS

## 2024-07-21 MED ORDER — OSELTAMIVIR PHOSPHATE 30 MG PO CAPS
30.0000 mg | ORAL_CAPSULE | Freq: Two times a day (BID) | ORAL | Status: AC
  Administered 2024-07-21 – 2024-07-26 (×10): 30 mg via ORAL
  Filled 2024-07-21 (×10): qty 1

## 2024-07-21 MED ORDER — HYDROCHLOROTHIAZIDE 25 MG PO TABS
25.0000 mg | ORAL_TABLET | Freq: Every day | ORAL | Status: DC
  Administered 2024-07-22 – 2024-07-23 (×2): 25 mg via ORAL
  Filled 2024-07-21 (×2): qty 1

## 2024-07-21 MED ORDER — METHYLPREDNISOLONE SODIUM SUCC 125 MG IJ SOLR
125.0000 mg | Freq: Once | INTRAMUSCULAR | Status: AC
  Administered 2024-07-21: 125 mg via INTRAVENOUS
  Filled 2024-07-21: qty 2

## 2024-07-21 MED ORDER — ACETAMINOPHEN 650 MG RE SUPP: 650.0000 mg | Freq: Four times a day (QID) | RECTAL | Status: DC | PRN

## 2024-07-21 MED ORDER — METOPROLOL TARTRATE 25 MG PO TABS
25.0000 mg | ORAL_TABLET | Freq: Two times a day (BID) | ORAL | Status: DC
  Administered 2024-07-21 – 2024-07-24 (×4): 25 mg via ORAL
  Filled 2024-07-21 (×6): qty 1

## 2024-07-21 MED ORDER — LATANOPROST 0.005 % OP SOLN
1.0000 [drp] | Freq: Every day | OPHTHALMIC | Status: DC
  Administered 2024-07-22 – 2024-07-27 (×6): 1 [drp] via OPHTHALMIC
  Filled 2024-07-21: qty 2.5

## 2024-07-21 MED ORDER — ONDANSETRON HCL 4 MG PO TABS: 4.0000 mg | ORAL_TABLET | Freq: Four times a day (QID) | ORAL | Status: DC | PRN

## 2024-07-21 MED ORDER — ONDANSETRON HCL 4 MG/2ML IJ SOLN: 4.0000 mg | Freq: Four times a day (QID) | INTRAMUSCULAR | Status: DC | PRN

## 2024-07-21 MED ORDER — SENNOSIDES-DOCUSATE SODIUM 8.6-50 MG PO TABS: 1.0000 | ORAL_TABLET | Freq: Every evening | ORAL | Status: DC | PRN

## 2024-07-21 MED ORDER — BISACODYL 5 MG PO TBEC
5.0000 mg | DELAYED_RELEASE_TABLET | Freq: Every day | ORAL | Status: DC | PRN
  Administered 2024-07-28: 5 mg via ORAL
  Filled 2024-07-21: qty 1

## 2024-07-21 MED ORDER — DOXAZOSIN MESYLATE 4 MG PO TABS
4.0000 mg | ORAL_TABLET | Freq: Every day | ORAL | Status: DC
  Administered 2024-07-22 – 2024-07-24 (×3): 4 mg via ORAL
  Filled 2024-07-21 (×3): qty 1

## 2024-07-21 MED ORDER — IPRATROPIUM-ALBUTEROL 0.5-2.5 (3) MG/3ML IN SOLN
3.0000 mL | Freq: Once | RESPIRATORY_TRACT | Status: AC
  Administered 2024-07-21: 3 mL via RESPIRATORY_TRACT
  Filled 2024-07-21: qty 3

## 2024-07-21 MED ORDER — ADULT MULTIVITAMIN W/MINERALS CH
1.0000 | ORAL_TABLET | Freq: Every day | ORAL | Status: DC
  Administered 2024-07-22 – 2024-07-28 (×7): 1 via ORAL
  Filled 2024-07-21 (×7): qty 1

## 2024-07-21 MED ORDER — ACETAMINOPHEN 325 MG PO TABS
650.0000 mg | ORAL_TABLET | Freq: Four times a day (QID) | ORAL | Status: DC | PRN
  Administered 2024-07-27: 650 mg via ORAL
  Filled 2024-07-21 (×2): qty 2

## 2024-07-21 MED ORDER — GUAIFENESIN 100 MG/5ML PO LIQD
5.0000 mL | ORAL | Status: DC | PRN
  Administered 2024-07-24 – 2024-07-27 (×4): 5 mL via ORAL
  Filled 2024-07-21 (×4): qty 10

## 2024-07-21 MED ORDER — LOSARTAN POTASSIUM 50 MG PO TABS
50.0000 mg | ORAL_TABLET | Freq: Every day | ORAL | Status: DC
  Administered 2024-07-22 – 2024-07-24 (×3): 50 mg via ORAL
  Filled 2024-07-21 (×3): qty 1

## 2024-07-21 MED ORDER — ENOXAPARIN SODIUM 40 MG/0.4ML IJ SOSY
40.0000 mg | PREFILLED_SYRINGE | INTRAMUSCULAR | Status: DC
  Administered 2024-07-21 – 2024-07-27 (×7): 40 mg via SUBCUTANEOUS
  Filled 2024-07-21 (×7): qty 0.4

## 2024-07-21 NOTE — ED Provider Triage Note (Signed)
 Emergency Medicine Provider Triage Evaluation Note  [name] , a 89 y.o. male  was evaluated in triage.  Pt complains of cough and shortness of breath.  Review of Systems  Positive: Cough, shortness of breath, fatigue Negative: Nausea, vomiting  Physical Exam  There were no vitals taken for this visit. Gen:   Awake, no distress   Resp:  Normal effort  MSK:   Moves extremities without difficulty   Medical Decision Making  Medically screening exam initiated at 10:48 AM.  Appropriate orders placed.  [name] was informed that the remainder of the evaluation will be completed by another provider, this initial triage assessment does not replace that evaluation, and the importance of remaining in the ED until their evaluation is complete.  Patient presenting for shortness of breath, cough, fatigue.  He is not on oxygen at baseline.  EMS noted SpO2 of 88% on room air.  They did place him on 2 L of supplemental oxygen.  He does have a history of CHF.  He does have lower extremity pitting edema on exam.   Melvenia Motto, MD 07/21/24 1049

## 2024-07-21 NOTE — ED Notes (Signed)
Attending Provider at bedside. 

## 2024-07-21 NOTE — ED Triage Notes (Signed)
 Pt arrived via [facility] from home with complaint of sickness with cough that he has had off and on for 2-3 years. Cough sounds congested and reports ankle swelling. Pt sats initially 89% on RA at home and patient placed on 02/3L

## 2024-07-21 NOTE — H&P (Signed)
 " History and Physical  [name] FMW:[id number] DOB: [birth year] DOA: 07/21/2024  PCP: Gib Charleston, MD   Chief Complaint: Shortness of breath and cough  HPI: [name] is a 89 y.o. male with medical history significant for HTN, HLD, HFpEF, CAD s/p PCI and splenic laceration who presented to the ED for evaluation of shortness of breath and cough. Patient reports that over the last 2 to 3 days, he has had persistent and congested cough with difficulty bringing out phlegm.  He has had associated shortness of breath and rhinorrhea and had some watery stools last night. He endorsed mild leg swelling and wheezing but denies any fevers, chills, nausea, vomiting, abdominal pain, chest pain, headache or dizziness.  On EMS arrival, patient was found to be hypoxic in the high 80s, placed on 2 L [place].  ED Course: Initial vitals show patient afebrile, RR 14-21, HR 60-80s, SBP 140-170s, SpO2 100% on 4 L [place]. Initial labs significant for proBNP 1997, creatinine 1.37, WBC 8.5, Hgb 11.9, troponin 35-40-46, positive influenza A by PCR, UA with no signs of infection. EKG shows normal sinus rhythm. CXR shows mild cardiomegaly.  CTA chest PE study negative for PE. Pt received IV Solu-Medrol , DuoNebs, IV Lasix  40 mg x 1. TRH was consulted for admission.   Review of Systems: Please see HPI for pertinent positives and negatives. A complete 10 system review of systems are otherwise negative.  Past Medical History:  Diagnosis Date   CAD (coronary artery disease)    Hypercholesteremia    Hyperlipidemia    Hypertension    Spleen injury    Past Surgical History:  Procedure Laterality Date   CHOLECYSTECTOMY     CORONARY STENT PLACEMENT     THYROID  SURGERY     growth removed   Social History:  reports that he has never smoked. He has never used smokeless tobacco. He reports that he does not drink alcohol and does not use drugs.  Allergies[1]  Family History  Problem Relation Age of Onset   Heart  disease Father    Heart attack Father      Prior to Admission medications  Medication Sig Start Date End Date Taking? Authorizing Provider  aluminum-magnesium hydroxide 200-200 MG/5ML suspension Take 5 mLs by mouth as needed for indigestion.   Yes [provider]  DIGESTIVE ENZYMES PO Take 1 capsule by mouth daily.   Yes [provider]  docusate sodium  (COLACE) 100 MG capsule Take 100 mg by mouth daily as needed for mild constipation.   Yes [provider]  doxazosin  (CARDURA ) 4 MG tablet Take 4 mg by mouth daily.   Yes [provider]  furosemide  (LASIX ) 20 MG tablet Take 1 tablet (20 mg total) by mouth daily. 07/16/24  Yes Jeffrie Oneil BROCKS, MD  hydrochlorothiazide  (HYDRODIURIL ) 25 MG tablet Take 1 tablet by mouth daily. 10/25/21  Yes [provider]  latanoprost  (XALATAN ) 0.005 % ophthalmic solution Place 1 drop into both eyes at bedtime.  10/12/15  Yes [provider]  losartan  (COZAAR ) 50 MG tablet Take 50 mg by mouth daily.   Yes [provider]  metoprolol  tartrate (LOPRESSOR ) 50 MG tablet Take 25 mg by mouth 2 (two) times daily. 07/22/15  Yes [provider]  Multiple Vitamins-Minerals (EMERGEN-C IMMUNE+ PO) Take 1 tablet by mouth daily.   Yes [provider]  omeprazole (PRILOSEC OTC) 20 MG tablet Take 20 mg by mouth daily.   Yes [provider]  polyethylene glycol (MIRALAX  /  GLYCOLAX ) 17 g packet Take 17 g by mouth 2 (two) times daily.   Yes [provider]    Physical Exam: BP (!) 172/61   Pulse 70   Temp 97.7 F (36.5 C) (Oral)   Resp 20   Ht 5' 8 (1.727 m)   Wt 76 kg   SpO2 94%   BMI 25.48 kg/m  General: Pleasant, acutely ill elderly man laying in bed. No acute distress. HEENT: [place]/AT. Anicteric sclera. Rhinorrhea. CV: RRR. No murmurs, rubs, or gallops. Pulmonary: On 4 L [place]. Lungs CTAB. Normal effort. Moderate expiratory and inspiratory wheezes. Abdominal: Soft, nontender,  nondistended. Normal bowel sounds. Extremities: Trace BLE pitting edema. Palpable radial and DP pulses. Normal ROM. Skin: Warm and dry. No obvious rash or lesions. Neuro: A&Ox3. Moves all extremities. Normal sensation to light touch. No focal deficit. Psych: Normal mood and affect          Labs on Admission:  Basic Metabolic Panel: Recent Labs  Lab 07/21/24 1120 07/21/24 1232  NA 139 138  K 4.2 3.9  CL  --  99  CO2  --  27  GLUCOSE  --  109*  BUN  --  38*  CREATININE  --  1.37*  CALCIUM   --  8.8*   Liver Function Tests: Recent Labs  Lab 07/21/24 1232  AST 21  ALT 17  ALKPHOS 100  BILITOT 0.6  PROT 6.8  ALBUMIN 4.0   No results for input(s): LIPASE, AMYLASE in the last 168 hours. No results for input(s): AMMONIA in the last 168 hours. CBC: Recent Labs  Lab 07/21/24 1120 07/21/24 1232  WBC  --  8.5  NEUTROABS  --  6.9  HGB 11.6* 11.9*  HCT 34.0* 36.4*  MCV  --  98.1  PLT  --  145*   Cardiac Enzymes: No results for input(s): CKTOTAL, CKMB, CKMBINDEX, TROPONINI in the last 168 hours. BNP (last 3 results) No results for input(s): BNP in the last 8760 hours.  ProBNP (last 3 results) Recent Labs    07/21/24 1232  PROBNP 1,997.0*    CBG: No results for input(s): GLUCAP in the last 168 hours.  Radiological Exams on Admission: CT Angio Chest PE W and/or Wo Contrast Result Date: 07/21/2024 EXAM: CTA of the Chest with contrast for PE 07/21/2024 03:59:00 PM TECHNIQUE: CTA of the chest was performed after the administration of intravenous contrast. Multiplanar reformatted images are provided for review. MIP images are provided for review. Automated exposure control, iterative reconstruction, and/or weight based adjustment of the mA/kV was utilized to reduce the radiation dose to as low as reasonably achievable. COMPARISON: 04/15/2013 CLINICAL HISTORY: Pulmonary embolism (PE) suspected, high prob. FINDINGS: PULMONARY ARTERIES: Pulmonary arteries are  adequately opacified for evaluation. No pulmonary embolism. Main pulmonary artery is normal in caliber. MEDIASTINUM: The heart demonstrates mild cardiomegaly and dense multivessel coronary atherosclerosis. The pericardium demonstrates no acute abnormality. Diffuse mixed density calcified atherosclerosis throughout the aorta and great vessels. Slight enlargement of a left thyroid  lobe nodule with substernal extension, measuring 4.5 cm (previously 3.8 cm). LYMPH NODES: No mediastinal, hilar or axillary lymphadenopathy. LUNGS AND PLEURA: Trace bilateral pleural effusions with bibasilar compressive atelectasis. Scattered atelectasis also noted in the lung bases. Mild centrilobular emphysema. Small pneumatocele in the right lower lobe. 2 mm micronodule in the left upper lobe (axial 34). A couple of adjacent 2 to 3 mm nodules noted in the lateral right upper lobe (axial 70). 3 mm subperiosteal nodule in the anterior right upper  lobe (axial 81). No focal consolidation or pulmonary edema. No pneumothorax. UPPER ABDOMEN: Limited images of the upper abdomen are unremarkable. SOFT TISSUES AND BONES: Soft tissue nodularity in the subcutaneous tissues just anterior to the proximal left humerus (axial 29), measuring 11 x 30 mm, possibly reflecting subcutaneous contusion or hematoma. Thoracic dish. Multilevel degenerative disc disease in the spine. No acute bone abnormality. IMPRESSION: 1. No pulmonary embolism. 2. Trace bilateral pleural effusions with bibasilar compressive atelectasis. 3. Soft tissue nodularity in the subcutaneous tissues just anterior to the proximal left humerus (axial 29), measuring 11 x 30 mm, possibly reflecting subcutaneous contusion or hematoma. Correlation with physical exam findings requested. 4. A few scattered 2 to 3 mm nodules noted in the lungs, as described above, are likely infectious or inflammatory. Electronically signed by: Rogelia Myers MD MD 07/21/2024 05:27 PM EST RP Workstation: [id number]    DG Chest 1 View Result Date: 07/21/2024 EXAM: 1 VIEW(S) XRAY OF THE CHEST 07/21/2024 11:57:00 AM COMPARISON: 12/31/21. CLINICAL HISTORY: SOB (shortness of breath) FINDINGS: LUNGS AND PLEURA: No focal pulmonary opacity. No pleural effusion. No pneumothorax. HEART AND MEDIASTINUM: Mild cardiomegaly. BONES AND SOFT TISSUES: No acute osseous abnormality. IMPRESSION: 1. Mild cardiomegaly. 2. No acute findings. Electronically signed by: Waddell Calk MD MD 07/21/2024 01:43 PM EST RP Workstation: [id number]   Assessment/Plan [name] is a 89 y.o. male with medical history significant for HTN, HLD, HFpEF, CAD s/p PCI and splenic laceration who presented to the ED for evaluation of shortness of breath and cough and admitted for acute hypoxic respiratory failure.  # Acute respiratory failure with hypoxia - Patient presented with progressive shortness of breath found to be hypoxic requiring up to 4 L [place] - This is in the setting of CHF exacerbation and acute bronchitis from influenza A infection -Continue supplemental O2, wean as able - IS, flutter valve  # Acute on chronic diastolic HF - Presented with progressive shortness of breath and leg swelling - Stress test in 2017 showed EF of 57%, no documented echo - proBNP elevated to ~2000, patient mildly overloaded on exam - Start IV lasix  40 mg daily - Continue Lopressor  - Follow up echocardiogram - Strict I&O, daily weights - Maintain K+ > 4.0, Mag > 2.0 - Telemetry  # Influenza A infection # Acute bronchitis - Patient presented with 2-3 days of shortness of breath, cough and rhinorrhea - Respiratory panel positive for influenza A, patient with persistent wheezing on exam - Start Tamiflu  30 mg twice daily for 5 days and prednisone  40 mg daily - As needed Robitussin for cough and as needed DuoNeb for SOB/wheezing - Droplet precautions  # HTN - BP elevated with SBP in the 130-170s - Continue Lopressor , HCTZ, losartan  and Cardura   #  CKD 3B - Creatinine of 1.37, around baseline of 1.3-1.5 - Trend renal function and avoid nephrotoxic agents   DVT prophylaxis: Lovenox      Code Status: Limited: Do not attempt resuscitation (DNR) -DNR-LIMITED -Do Not Intubate/DNI   Consults called: None  Family Communication: No family at bedside  Severity of Illness: The appropriate patient status for this patient is INPATIENT. Inpatient status is judged to be reasonable and necessary in order to provide the required intensity of service to ensure the patient's safety. The patient's presenting symptoms, physical exam findings, and initial radiographic and laboratory data in the context of their chronic comorbidities is felt to place them at high risk for further clinical deterioration. Furthermore, it is not anticipated that the patient  will be medically stable for discharge from the hospital within 2 midnights of admission.   * I certify that at the point of admission it is my clinical judgment that the patient will require inpatient hospital care spanning beyond 2 midnights from the point of admission due to high intensity of service, high risk for further deterioration and high frequency of surveillance required.*  Level of care: Telemetry    Lou Claretta HERO, MD 07/21/2024, 9:13 PM [facility] Pager: [phone] Isaiah 41:10   If 7PM-7AM, please contact night-coverage [email or web address] Password [email or web address]     [1]  Allergies Allergen Reactions   Morphine  And Codeine Other (See Comments)    Made him go crazy, made his insides hurt   Avelox [Moxifloxacin Hcl In Nacl]     Dizziness   "

## 2024-07-21 NOTE — ED Provider Notes (Signed)
 Patient is a 89 year old male who is presenting today for evaluation of shortness of breath in setting of several days of cough as well as diarrhea.  Notable hypoxia requiring new oxygen requirement and laboratory evaluation on the previous team notable for elevated BNP and elevated troponin.  I was to follow-up CTA PE that did not show any evidence of thromboembolic process or obvious pneumonia.  Patient did have evidence of volume overload.  Patient remained with persistent oxygen requirement of 2 L with oxygen saturation ranging between 93 and 97%.  I suspect the patient's presentation likely related to multifactorial hypoxic respiratory failure.  With patient's diarrhea as well as ongoing cough there may be a viral component that is contributing to this.  Patient's COVID and flu is pending at this point in time.  Patient does have a significant cardiac history and prior history of HFrEF and with elevated BNP from baseline and evidence of volume overload there may also be a component of heart failure exacerbation that is contributing to his respiratory failure.  Patient will need admission to the inpatient service.  Physical Exam  BP 131/60   Pulse 78   Temp 98.7 F (37.1 C) (Oral)   Resp 17   Ht 5' 8 (1.727 m)   Wt 76 kg   SpO2 100%   BMI 25.48 kg/m   Physical Exam Constitutional:      Appearance: Normal appearance.  Pulmonary:     Effort: Pulmonary effort is normal. No respiratory distress.     Breath sounds: Rhonchi present. No wheezing.  Skin:    General: Skin is warm.  Neurological:     General: No focal deficit present.     Mental Status: He is alert and oriented to person, place, and time.     Cranial Nerves: No cranial nerve deficit.     Procedures  Procedures  ED Course / MDM   Clinical Course as of 07/21/24 1802  Mon Jul 21, 2024  1539 S - Hypoxic high 80s no baseline O2. Cough congestion rhinorrhea. 4L. S/p duo and steroids. F/u CTA PE. HFrEF. Hospitalist admit.   [DZ]    Clinical Course User Index [DZ] Laurita Sieving, MD   Medical Decision Making Amount and/or Complexity of Data Reviewed Labs: ordered. Radiology: ordered.  Risk Prescription drug management.         Laurita Sieving, MD 07/21/24 SHAUNNA    Tonia Chew, MD 07/21/24 2259

## 2024-07-21 NOTE — TOC CM/SW Note (Signed)
 TOC consult received for d/c planning needs. Follow-up to be completed with patient as appropriate.   Merilee Batty, MSN, RN Case Management [phone]

## 2024-07-21 NOTE — ED Notes (Signed)
 Patient transported to CT

## 2024-07-21 NOTE — ED Provider Notes (Signed)
 " [facility] EMERGENCY DEPARTMENT AT [facility] Provider Note   CSN: [id number] Arrival date & time: 07/21/24  1057     Patient presents with: Cough   [name] is a 89 y.o. male.  Cough Associated symptoms: shortness of breath   Patient is a 89 year old male presenting ED today with family at bedside for concerns for worsening cough and shortness of breath that has been ongoing x 3 days accompanied with some watery diarrhea starting last night.  Noted to have additional congestion and rhinorrhea.  Lives at home alone and was brought in by EMS who noted ox saturations in the high 80s on room air, placed on 2 L.  Noting that he recently had his diuretics refilled 2 days ago.  But has not been taking them for the last 2 years.  Notably was post to see his PCP for bilateral lower leg swelling but has not had a chance to see PCP yet.  Previous medical history of CAD, HTN, HLD, coronary stent placement, CKD, CHF  Denies fever, headache, vision changes, vertigo, chest pain, abdominal pain, nausea, vomiting, melena, hematochezia, dysuria, hematuria.    Prior to Admission medications  Medication Sig Start Date End Date Taking? Authorizing Provider  docusate sodium  (COLACE) 100 MG capsule Take 100 mg by mouth daily as needed for mild constipation.    [provider]  doxazosin  (CARDURA ) 4 MG tablet Take 4 mg by mouth daily.    [provider]  furosemide  (LASIX ) 20 MG tablet Take 1 tablet (20 mg total) by mouth daily. 07/16/24   Jeffrie Oneil BROCKS, MD  hydrochlorothiazide  (HYDRODIURIL ) 25 MG tablet Take 1 tablet by mouth daily. 10/25/21   [provider]  latanoprost  (XALATAN ) 0.005 % ophthalmic solution Place 1 drop into both eyes at bedtime.  10/12/15   [provider]  losartan  (COZAAR ) 50 MG tablet Take 50 mg by mouth daily.    [provider]  metoprolol  tartrate (LOPRESSOR ) 50 MG tablet Take 50 mg by mouth 2 (two) times daily.  07/22/15   [provider]  polyethylene glycol (MIRALAX  / GLYCOLAX ) 17 g packet Take 17 g by mouth 2 (two) times daily.    [provider]    Allergies: Morphine  and codeine and Avelox [moxifloxacin hcl in nacl]    Review of Systems  Respiratory:  Positive for cough and shortness of breath.   All other systems reviewed and are negative.   Updated Vital Signs BP 131/60   Pulse 78   Temp 98.7 F (37.1 C) (Oral)   Resp 17   Ht 5' 8 (1.727 m)   Wt 76 kg   SpO2 100%   BMI 25.48 kg/m   Physical Exam Vitals and nursing note reviewed.  Constitutional:      General: He is not in acute distress.    Appearance: Normal appearance. He is ill-appearing. He is not diaphoretic.  HENT:     Head: Normocephalic and atraumatic.     Nose: Rhinorrhea present.  Eyes:     General: No scleral icterus.       Right eye: No discharge.        Left eye: No discharge.     Extraocular Movements: Extraocular movements intact.     Conjunctiva/sclera: Conjunctivae normal.  Cardiovascular:     Rate and Rhythm: Normal rate and regular rhythm.     Pulses: Normal pulses.     Heart sounds: Normal heart sounds. No murmur heard.  No friction rub. No gallop.  Pulmonary:     Effort: No respiratory distress.     Breath sounds: No stridor. Wheezing and rhonchi present. No rales.     Comments: Tachypneic Chest:     Chest wall: No tenderness.  Abdominal:     General: Abdomen is flat. There is no distension.     Palpations: Abdomen is soft.     Tenderness: There is no abdominal tenderness. There is no right CVA tenderness, left CVA tenderness, guarding or rebound.  Musculoskeletal:        General: No swelling, deformity or signs of injury.     Cervical back: Normal range of motion. No rigidity.     Right lower leg: Edema present.     Left lower leg: Edema present.  Skin:    General: Skin is warm and dry.     Findings: No bruising, erythema or lesion.  Neurological:     General: No  focal deficit present.     Mental Status: He is alert and oriented to person, place, and time. Mental status is at baseline.     Sensory: No sensory deficit.     Motor: No weakness.  Psychiatric:        Mood and Affect: Mood normal.     (all labs ordered are listed, but only abnormal results are displayed) Labs Reviewed  CBC WITH DIFFERENTIAL/PLATELET - Abnormal; Notable for the following components:      Result Value   RBC 3.71 (*)    Hemoglobin 11.9 (*)    HCT 36.4 (*)    Platelets 145 (*)    All other components within normal limits  PRO BRAIN NATRIURETIC PEPTIDE - Abnormal; Notable for the following components:   Pro Brain Natriuretic Peptide 1,997.0 (*)    All other components within normal limits  COMPREHENSIVE METABOLIC PANEL WITH GFR - Abnormal; Notable for the following components:   Glucose, Bld 109 (*)    BUN 38 (*)    Creatinine, Ser 1.37 (*)    Calcium  8.8 (*)    GFR, Estimated 48 (*)    All other components within normal limits  I-STAT VENOUS BLOOD GAS, ED - Abnormal; Notable for the following components:   pO2, Ven 29 (*)    Bicarbonate 29.2 (*)    Acid-Base Excess 3.0 (*)    Calcium , Ion 1.14 (*)    HCT 34.0 (*)    Hemoglobin 11.6 (*)    All other components within normal limits  TROPONIN T, HIGH SENSITIVITY - Abnormal; Notable for the following components:   Troponin T High Sensitivity 35 (*)    All other components within normal limits  RESP PANEL BY RT-PCR (RSV, FLU A&B, COVID)  RVPGX2  URINALYSIS, ROUTINE W REFLEX MICROSCOPIC    EKG: EKG Interpretation Date/Time:  Monday July 21 2024 11:27:42 EST Ventricular Rate:  65 PR Interval:    QRS Duration:  88 QT Interval:  418 QTC Calculation: 434 R Axis:   80  Text Interpretation: Normal sinus rhythm Abnormal ECG No significant change since last tracing Confirmed by Ellouise Fine ([id number]) on 07/21/2024 1:10:49 PM  Radiology: DG Chest 1 View Result Date: 07/21/2024 EXAM: 1 VIEW(S) XRAY OF THE  CHEST 07/21/2024 11:57:00 AM COMPARISON: 12/31/21. CLINICAL HISTORY: SOB (shortness of breath) FINDINGS: LUNGS AND PLEURA: No focal pulmonary opacity. No pleural effusion. No pneumothorax. HEART AND MEDIASTINUM: Mild cardiomegaly. BONES AND SOFT TISSUES: No acute osseous abnormality. IMPRESSION: 1. Mild cardiomegaly. 2. No acute findings. Electronically signed  by: Waddell Calk MD MD 07/21/2024 01:43 PM EST RP Workstation: [id number]    Procedures   Medications Ordered in the ED  methylPREDNISolone  sodium succinate (SOLU-MEDROL ) 125 mg/2 mL injection 125 mg (125 mg Intravenous Given 07/21/24 1319)  ipratropium-albuterol  (DUONEB) 0.5-2.5 (3) MG/3ML nebulizer solution 3 mL (3 mLs Nebulization Given 07/21/24 1317)  furosemide  (LASIX ) injection 40 mg (40 mg Intravenous Given 07/21/24 1545)  iohexol  (OMNIPAQUE ) 350 MG/ML injection 75 mL (75 mLs Intravenous Contrast Given 07/21/24 1554)    Clinical Course as of 07/21/24 1601  Mon Jul 21, 2024  1539 S - Hypoxic high 80s no baseline O2. Cough congestion rhinorrhea. 4L. S/p duo and steroids. F/u CTA PE. HFrEF. Hospitalist admit.  [DZ]    Clinical Course User Index [DZ] Laurita Sieving, MD   Medical Decision Making Amount and/or Complexity of Data Reviewed Labs: ordered. Radiology: ordered.  Risk Prescription drug management.  This patient is a 89 year old male who presents to the ED for concern of cough, ingestion, shortness of breath that has been ongoing x 3 days, family at bedside reporting that he has had worsening shortness of breath requiring new oxygen demand by EMS who reported on 2 L.  Notably has not taken his Lasix  in the last 2 years, recently refilled couple days ago.  Worsening bilateral lower leg swelling.  Upon arrival patient was placed on 4 L O2 with persistent decrease in O2 saturations.  On physical exam, patient is in no acute distress, afebrile, alert and orient x 4, conversationally dyspneic, nontachypneic, nontachycardic.   Notably he does have bilateral lower leg edema present.  Rhonchi and wheezing bilaterally.  Patient was right side Medrol  and DuoNebs and on reevaluation had improved symptoms.  Chest x-ray does not show any acute normalities and BNP is borderline elevated for heart failure.  The patient's crepitation and new oxygen demand, and with negative CTA and persistent cough with no other infectious findings, will do CT PE study to evaluate for possible PE.   Differential diagnoses prior to evaluation: The emergent differential diagnosis includes, but is not limited to, URI, pneumonia, ACS, PE, sepsis,. This is not an exhaustive differential.   Past Medical History / Co-morbidities / Social History: CAD, HTN, HLD, coronary stent placement, CKD  Additional history: Chart reviewed. Pertinent results include:   Last saw PCP on 07/16/2024 for bilateral feet swelling  Lab Tests/Imaging studies: I personally interpreted labs/imaging and the pertinent results include:   CBC notes a anemia hemoglobin 0.9 CMP notes a improved creatinine and GFR from previous Troponin elevated 35 BNP noted to be 1997 I-STAT VBG notes a decreased O2 and elevated bicarb Chest x-ray unremarkable .  CT PE study pending  I agree with the radiologist interpretation.  Cardiac monitoring: EKG obtained and interpreted by myself and attending physician which shows: NSR  EKG Interpretation Date/Time:  Monday July 21 2024 11:27:42 EST Ventricular Rate:  65 PR Interval:    QRS Duration:  88 QT Interval:  418 QTC Calculation: 434 R Axis:   80  Text Interpretation: Normal sinus rhythm Abnormal ECG No significant change since last tracing Confirmed by Ellouise Fine ([id number]) on 07/21/2024 1:10:49 PM          Medications: I ordered medication including Lasix , Solu-Medrol , albuterol .  I have reviewed the patients home medicines and have made adjustments as needed.  Critical Interventions: None  Social Determinants  of Health: Lives at home alone  Disposition: 4:01 PM Care of Javaughn Opdahl  transferred to resident Dr. Sieving  Zhang and Dr. Zavitz at the end of my shift as the patient will require reassessment once labs/imaging have resulted. Patient presentation, ED course, and plan of care discussed with review of all pertinent labs and imaging. Please see his/her note for further details regarding further ED course and disposition. Plan at time of handoff is await PE study, likely admit for new oxygen demand. This may be altered or completely changed at the discretion of the oncoming team pending results of further workup.    Final diagnoses:  Shortness of breath  Increased oxygen demand    ED Discharge Orders     None          Beola Terrall RAMAN, [place] 07/21/24 1601  "

## 2024-07-21 NOTE — ED Notes (Signed)
 CCMD Called

## 2024-07-22 ENCOUNTER — Inpatient Hospital Stay (HOSPITAL_COMMUNITY)

## 2024-07-22 DIAGNOSIS — R6889 Other general symptoms and signs: Secondary | ICD-10-CM | POA: Diagnosis not present

## 2024-07-22 DIAGNOSIS — I5033 Acute on chronic diastolic (congestive) heart failure: Secondary | ICD-10-CM

## 2024-07-22 DIAGNOSIS — R0609 Other forms of dyspnea: Secondary | ICD-10-CM | POA: Diagnosis not present

## 2024-07-22 DIAGNOSIS — N1832 Chronic kidney disease, stage 3b: Secondary | ICD-10-CM

## 2024-07-22 DIAGNOSIS — J101 Influenza due to other identified influenza virus with other respiratory manifestations: Secondary | ICD-10-CM

## 2024-07-22 DIAGNOSIS — J208 Acute bronchitis due to other specified organisms: Secondary | ICD-10-CM

## 2024-07-22 LAB — ECHOCARDIOGRAM COMPLETE
Area-P 1/2: 4.21 cm2
Calc EF: 73.5 %
Height: 68 in
S' Lateral: 2.9 cm
Single Plane A2C EF: 68.8 %
Single Plane A4C EF: 76.2 %
Weight: 2680.79 [oz_av]

## 2024-07-22 LAB — BASIC METABOLIC PANEL WITH GFR
Anion gap: 11 (ref 5–15)
BUN: 40 mg/dL — ABNORMAL HIGH (ref 8–23)
CO2: 30 mmol/L (ref 22–32)
Calcium: 8.7 mg/dL — ABNORMAL LOW (ref 8.9–10.3)
Chloride: 97 mmol/L — ABNORMAL LOW (ref 98–111)
Creatinine, Ser: 1.34 mg/dL — ABNORMAL HIGH (ref 0.61–1.24)
GFR, Estimated: 49 mL/min — ABNORMAL LOW
Glucose, Bld: 115 mg/dL — ABNORMAL HIGH (ref 70–99)
Potassium: 4 mmol/L (ref 3.5–5.1)
Sodium: 138 mmol/L (ref 135–145)

## 2024-07-22 LAB — CBC
HCT: 35 % — ABNORMAL LOW (ref 39.0–52.0)
Hemoglobin: 11.4 g/dL — ABNORMAL LOW (ref 13.0–17.0)
MCH: 31.4 pg (ref 26.0–34.0)
MCHC: 32.6 g/dL (ref 30.0–36.0)
MCV: 96.4 fL (ref 80.0–100.0)
Platelets: 146 K/uL — ABNORMAL LOW (ref 150–400)
RBC: 3.63 MIL/uL — ABNORMAL LOW (ref 4.22–5.81)
RDW: 12.9 % (ref 11.5–15.5)
WBC: 9.4 K/uL (ref 4.0–10.5)
nRBC: 0 % (ref 0.0–0.2)

## 2024-07-22 MED ORDER — IPRATROPIUM-ALBUTEROL 0.5-2.5 (3) MG/3ML IN SOLN: 3.0000 mL | Freq: Four times a day (QID) | RESPIRATORY_TRACT | Status: DC | PRN

## 2024-07-22 MED ORDER — FUROSEMIDE 10 MG/ML IJ SOLN
40.0000 mg | Freq: Every day | INTRAMUSCULAR | Status: DC
  Administered 2024-07-22 – 2024-07-24 (×3): 40 mg via INTRAVENOUS
  Filled 2024-07-22 (×3): qty 4

## 2024-07-22 MED ORDER — PNEUMOCOCCAL 20-VAL CONJ VACC 0.5 ML IM SUSY
0.5000 mL | PREFILLED_SYRINGE | INTRAMUSCULAR | Status: AC
  Administered 2024-07-28: 0.5 mL via INTRAMUSCULAR
  Filled 2024-07-22 (×2): qty 0.5

## 2024-07-22 MED ORDER — PREDNISONE 20 MG PO TABS
40.0000 mg | ORAL_TABLET | Freq: Every day | ORAL | Status: AC
  Administered 2024-07-22 – 2024-07-25 (×4): 40 mg via ORAL
  Filled 2024-07-22 (×4): qty 2

## 2024-07-22 NOTE — Progress Notes (Signed)
 " Progress Note   Patient: [name] FMW:[id number] DOB: Jun 03, 1929 DOA: 07/21/2024     1 DOS: the patient was seen and examined on 07/22/2024   Brief hospital course: 89 y.o. male with medical history significant for HTN, HLD, HFpEF, CAD s/p PCI and splenic laceration who presented to the ED for evaluation of shortness of breath and cough. Patient reports that over the last 2 to 3 days, he has had persistent and congested cough with difficulty bringing out phlegm.  He has had associated shortness of breath and rhinorrhea and had some watery stools last night. He endorsed mild leg swelling and wheezing but denies any fevers, chills, nausea, vomiting, abdominal pain, chest pain, headache or dizziness.  On EMS arrival, patient was found to be hypoxic in the high 80s, placed on 2 L [place].   ED Course: Initial vitals show patient afebrile, RR 14-21, HR 60-80s, SBP 140-170s, SpO2 100% on 4 L [place]. Initial labs significant for proBNP 1997, creatinine 1.37, WBC 8.5, Hgb 11.9, troponin 35-40-46, positive influenza A by PCR, UA with no signs of infection. EKG shows normal sinus rhythm. CXR shows mild cardiomegaly.  CTA chest PE study negative for PE. Pt received IV Solu-Medrol , DuoNebs, IV Lasix  40 mg x 1. TRH was consulted for admission.  Assessment and Plan: # Acute respiratory failure with hypoxia - Patient presented with progressive shortness of breath found to be hypoxic requiring up to 4 L [place] - This is in the setting of CHF exacerbation and acute bronchitis from influenza A infection -later weaned to [facility] this afternoon - Continue duonebs and prednisone    # Acute on chronic diastolic HF - Presented with progressive shortness of breath and leg swelling - Stress test in 2017 showed EF of 57%, no documented echo - proBNP elevated to ~2000, patient mildly overloaded on exam - continue IV lasix  40mg  daily - Continue Lopressor , cozaar  - Repeat 2d echo with normal LVEF and grade II diastolic  dysfunction   # Influenza A infection # Acute bronchitis - Patient presented with 2-3 days of shortness of breath, cough and rhinorrhea - Respiratory panel positive for influenza A - continued on Tamiflu  30 mg twice daily for 5 days and prednisone  40 mg daily - As needed Robitussin for cough and as needed DuoNeb for SOB/wheezing - Droplet precautions   # HTN - BP elevated with SBP in the 130-170s - Continue Lopressor , HCTZ, losartan  and Cardura    # CKD 3B - Creatinine of 1.37, around baseline of 1.3-1.5 - Trend renal function and avoid nephrotoxic agents -recheck bmet in AM      Subjective: Still complaining of generalized malaise this AM  Physical Exam: Vitals:   07/22/24 1125 07/22/24 1139 07/22/24 1214 07/22/24 1712  BP:  (!) 156/56 (!) 148/58 (!) 138/52  Pulse: (!) 55   (!) 58  Resp: (!) 22  18 18   Temp:  97.9 F (36.6 C) 98 F (36.7 C) 98.6 F (37 C)  TempSrc: Oral  Oral Oral  SpO2: 94%   99%  Weight:   73.4 kg   Height:   5' 8 (1.727 m)    General exam: Awake, laying in bed, in nad Respiratory system: Normal respiratory effort, decreased breath sounds Cardiovascular system: regular rate, s1, s2 Gastrointestinal system: Soft, nondistended, positive BS Central nervous system: CN2-12 grossly intact, strength intact Extremities: Perfused, no clubbing Skin: Normal skin turgor, no notable skin lesions seen Psychiatry: Mood normal // affect seems normal  Data Reviewed:  Labs  reviewed: Na 138, K 4.0, Cr 1.34, WBC 9.4, Hgb 11.4, Plts 146  Family Communication: Pt in room, family not at bedside  Disposition: Status is: Inpatient Remains inpatient appropriate because: severity of illness  Planned Discharge Destination: Home   Author: Garnette Pelt, MD 07/22/2024 6:23 PM  For on call review [email or web address].  "

## 2024-07-22 NOTE — ED Notes (Signed)
 Patient sitting upright in bed watching TV, SPO2 100% on 5L via [place]. Decreased O2 to 3L, SPO2 94% on same.

## 2024-07-22 NOTE — ED Notes (Signed)
 Pt as episode of oxygen desaturation decreasing to 87% on 2L [place]. Oxygen increased from 2L to 3L [place] causing oxygen to increase to 93%. Pt denies any pain or shortness of breath.

## 2024-07-22 NOTE — ED Notes (Addendum)
 Spoke with Dr Lou about pt's oxygen saturation. No new orders at this time.

## 2024-07-22 NOTE — Hospital Course (Signed)
 89 y.o. male with medical history significant for HTN, HLD, HFpEF, CAD s/p PCI and splenic laceration who presented to the ED for evaluation of shortness of breath and cough. Patient reports that over the last 2 to 3 days, he has had persistent and congested cough with difficulty bringing out phlegm.  He has had associated shortness of breath and rhinorrhea and had some watery stools last night. He endorsed mild leg swelling and wheezing but denies any fevers, chills, nausea, vomiting, abdominal pain, chest pain, headache or dizziness.  On EMS arrival, patient was found to be hypoxic in the high 80s, placed on 2 L [place].   ED Course: Initial vitals show patient afebrile, RR 14-21, HR 60-80s, SBP 140-170s, SpO2 100% on 4 L [place]. Initial labs significant for proBNP 1997, creatinine 1.37, WBC 8.5, Hgb 11.9, troponin 35-40-46, positive influenza A by PCR, UA with no signs of infection. EKG shows normal sinus rhythm. CXR shows mild cardiomegaly.  CTA chest PE study negative for PE. Pt received IV Solu-Medrol , DuoNebs, IV Lasix  40 mg x 1. TRH was consulted for admission.

## 2024-07-22 NOTE — Progress Notes (Signed)
" °  Echocardiogram 2D Echocardiogram has been performed.  Tinnie FORBES Gosling RDCS 07/22/2024, 11:40 AM "

## 2024-07-22 NOTE — Progress Notes (Signed)
 Heart Failure Navigator Progress Note  Assessed for Heart & Vascular TOC clinic readiness.  Patient has a scheduled follow up with [facility] on 1/27.  Navigator available for reassessment of patient.   Duwaine Plant, PharmD, BCPS Heart Failure Stewardship Pharmacist Phone [phone]

## 2024-07-22 NOTE — ED Notes (Signed)
 Floor made aware patient coming up. Patient denies any needs at this time.

## 2024-07-23 DIAGNOSIS — J208 Acute bronchitis due to other specified organisms: Secondary | ICD-10-CM

## 2024-07-23 DIAGNOSIS — J9601 Acute respiratory failure with hypoxia: Secondary | ICD-10-CM

## 2024-07-23 DIAGNOSIS — I5033 Acute on chronic diastolic (congestive) heart failure: Secondary | ICD-10-CM

## 2024-07-23 LAB — CBC
HCT: 32.9 % — ABNORMAL LOW (ref 39.0–52.0)
Hemoglobin: 11 g/dL — ABNORMAL LOW (ref 13.0–17.0)
MCH: 31.8 pg (ref 26.0–34.0)
MCHC: 33.4 g/dL (ref 30.0–36.0)
MCV: 95.1 fL (ref 80.0–100.0)
Platelets: 140 K/uL — ABNORMAL LOW (ref 150–400)
RBC: 3.46 MIL/uL — ABNORMAL LOW (ref 4.22–5.81)
RDW: 13 % (ref 11.5–15.5)
WBC: 8 K/uL (ref 4.0–10.5)
nRBC: 0 % (ref 0.0–0.2)

## 2024-07-23 LAB — COMPREHENSIVE METABOLIC PANEL WITH GFR
ALT: 14 U/L (ref 0–44)
AST: 28 U/L (ref 15–41)
Albumin: 3.4 g/dL — ABNORMAL LOW (ref 3.5–5.0)
Alkaline Phosphatase: 76 U/L (ref 38–126)
Anion gap: 9 (ref 5–15)
BUN: 44 mg/dL — ABNORMAL HIGH (ref 8–23)
CO2: 30 mmol/L (ref 22–32)
Calcium: 8.4 mg/dL — ABNORMAL LOW (ref 8.9–10.3)
Chloride: 102 mmol/L (ref 98–111)
Creatinine, Ser: 1.31 mg/dL — ABNORMAL HIGH (ref 0.61–1.24)
GFR, Estimated: 51 mL/min — ABNORMAL LOW
Glucose, Bld: 91 mg/dL (ref 70–99)
Potassium: 3.6 mmol/L (ref 3.5–5.1)
Sodium: 141 mmol/L (ref 135–145)
Total Bilirubin: 0.4 mg/dL (ref 0.0–1.2)
Total Protein: 5.7 g/dL — ABNORMAL LOW (ref 6.5–8.1)

## 2024-07-23 MED ORDER — POTASSIUM CHLORIDE CRYS ER 20 MEQ PO TBCR
40.0000 meq | EXTENDED_RELEASE_TABLET | Freq: Once | ORAL | Status: AC
  Administered 2024-07-23: 40 meq via ORAL
  Filled 2024-07-23: qty 2

## 2024-07-23 MED ORDER — POLYETHYLENE GLYCOL 3350 17 G PO PACK
17.0000 g | PACK | Freq: Two times a day (BID) | ORAL | Status: DC
  Administered 2024-07-23 – 2024-07-28 (×6): 17 g via ORAL
  Filled 2024-07-23 (×8): qty 1

## 2024-07-23 NOTE — Progress Notes (Signed)
 "  [facility] PROGRESS NOTE   [name] FMW:[id number] DOB: Aug 05, 1928 DOA: 07/21/2024  PCP: Gib Charleston, MD  Brief History: 89 y.o. male with medical history significant for HTN, HLD, HFpEF, CAD s/p PCI and splenic laceration who presented to the ED for evaluation of shortness of breath and cough.  Patient tested positive for influenza.  Was also thought to have diastolic CHF.  Patient was hospitalized for further management.     Consultants: None  Procedures: None    Subjective/Interval History: Patient mentions that he feels well.  Noted to be lying flat in the bed and does not appear to be dyspneic.  Denies any chest pain.  No nausea vomiting.  No family at bedside.    Assessment/Plan:  Acute respiratory failure with hypoxia Patient presented with progressive shortness of breath found to be hypoxic requiring up to 4 L [place] This is in the setting of CHF exacerbation and acute bronchitis from influenza A infection. Noted to be on 3 L of oxygen via nasal cannula. Mobilize.  May benefit from incentive spirometry.  Noted to be on nebulizer treatments and steroids as well.  No wheezing appreciated this morning.  Acute on chronic diastolic HF - Presented with progressive shortness of breath and leg swelling - Stress test in 2017 showed EF of 57%, no documented echo - proBNP elevated to ~2000, patient mildly overloaded on exam Patient was started on IV furosemide . Strict ins and outs and daily weights.  Monitor renal function and electrolytes closely. Patient also noted to be on metoprolol  and ARB. Echocardiogram was done which showed normal LVEF but did show grade 2 diastolic dysfunction. Continue IV furosemide  for another day and transition to oral furosemide  tomorrow. Patient underwent CT angiogram which did not show any PE.  Trace bilateral pleural effusions were noted.   Influenza A infection/Acute bronchitis - Patient presented with 2-3 days of shortness of  breath, cough and rhinorrhea - Respiratory panel positive for influenza A - continued on Tamiflu  30 mg twice daily for 5 days and prednisone  40 mg daily - As needed Robitussin for cough and as needed DuoNeb for SOB/wheezing - Droplet precautions   Essential hypertension Noted to be on Lopressor , HCTZ, losartan  and Cardura .  Hold HCTZ while he is getting furosemide  Occasional high readings noted.  Would not be too aggressive with blood pressure control.   # CKD 3B - Creatinine of 1.37, around baseline of 1.3-1.5 - Trend renal function and avoid nephrotoxic agents  DVT Prophylaxis: Lovenox  Code Status: DNR. Family Communication: No family at bedside Disposition Plan: Await PT OT eval.  It appears that he came from home.    Medications: Scheduled:  doxazosin   4 mg Oral Daily   enoxaparin  (LOVENOX ) injection  40 mg Subcutaneous Q24H   furosemide   40 mg Intravenous Daily   hydrochlorothiazide   25 mg Oral Daily   latanoprost   1 drop Both Eyes QHS   losartan   50 mg Oral Daily   metoprolol  tartrate  25 mg Oral BID   multivitamin with minerals  1 tablet Oral Daily   oseltamivir   30 mg Oral BID   pneumococcal 20-valent conjugate vaccine  0.5 mL Intramuscular Tomorrow-1000   predniSONE   40 mg Oral Q breakfast   Continuous: PRN:acetaminophen  **OR** acetaminophen , bisacodyl , guaiFENesin , ipratropium-albuterol , ondansetron  **OR** ondansetron  (ZOFRAN ) IV, senna-docusate  Antibiotics: Anti-infectives (From admission, onward)    Start     Dose/Rate Route Frequency Ordered Stop   07/21/24 2215  oseltamivir  (TAMIFLU ) capsule 30 mg  30 mg Oral 2 times daily 07/21/24 2209 07/26/24 2159       Objective:  Vital Signs  Vitals:   07/22/24 2013 07/22/24 2330 07/23/24 0351 07/23/24 0759  BP: 130/62 (!) 149/60 (!) 133/57 (!) 149/58  Pulse: 67 (!) 57 (!) 52 62  Resp: (!) 21 16 16 18   Temp: 98.3 F (36.8 C) 98.3 F (36.8 C) 98 F (36.7 C) 98 F (36.7 C)  TempSrc: Oral Oral Oral  Oral  SpO2: 93% 97% 97% 96%  Weight:   73.9 kg   Height:        Intake/Output Summary (Last 24 hours) at 07/23/2024 1049 Last data filed at 07/22/2024 2335 Gross per 24 hour  Intake 480 ml  Output 900 ml  Net -420 ml   Filed Weights   07/21/24 1121 07/22/24 1214 07/23/24 0351  Weight: 76 kg 73.4 kg 73.9 kg    General appearance: Awake alert.  In no distress Resp: Normal effort at rest.  Diminished air entry at the bases with few crackles.  No wheezing or rhonchi. Cardio: S1-S2 is normal regular.  No S3-S4.  No rubs murmurs or bruit GI: Abdomen is soft.  Nontender nondistended.  Bowel sounds are present normal.  No masses organomegaly Extremities: No edema.  Moving All of his extremities.  Physical deconditioning is noted. Neurologic: No focal neurological deficits.    Lab Results:  Data Reviewed: I have personally reviewed following labs and reports of the imaging studies  CBC: Recent Labs  Lab 07/21/24 1120 07/21/24 1232 07/22/24 1328 07/23/24 0454  WBC  --  8.5 9.4 8.0  NEUTROABS  --  6.9  --   --   HGB 11.6* 11.9* 11.4* 11.0*  HCT 34.0* 36.4* 35.0* 32.9*  MCV  --  98.1 96.4 95.1  PLT  --  145* 146* 140*    Basic Metabolic Panel: Recent Labs  Lab 07/21/24 1120 07/21/24 1232 07/22/24 1328 07/23/24 0454  NA 139 138 138 141  K 4.2 3.9 4.0 3.6  CL  --  99 97* 102  CO2  --  27 30 30   GLUCOSE  --  109* 115* 91  BUN  --  38* 40* 44*  CREATININE  --  1.37* 1.34* 1.31*  CALCIUM   --  8.8* 8.7* 8.4*    GFR: Estimated Creatinine Clearance: 34.1 mL/min (A) (by C-G formula based on SCr of 1.31 mg/dL (H)).  Liver Function Tests: Recent Labs  Lab 07/21/24 1232 07/23/24 0454  AST 21 28  ALT 17 14  ALKPHOS 100 76  BILITOT 0.6 0.4  PROT 6.8 5.7*  ALBUMIN 4.0 3.4*    BNP (last 3 results) Recent Labs    07/21/24 1232  PROBNP 1,997.0*    Recent Results (from the past 240 hours)  Resp panel by RT-PCR (RSV, Flu A&B, Covid) Anterior Nasal Swab     Status:  Abnormal   Collection Time: 07/21/24 12:17 PM   Specimen: Anterior Nasal Swab  Result Value Ref Range Status   SARS Coronavirus 2 by RT PCR NEGATIVE NEGATIVE Final   Influenza A by PCR POSITIVE (A) NEGATIVE Final   Influenza B by PCR NEGATIVE NEGATIVE Final    Comment: (NOTE) The Xpert Xpress SARS-CoV-2/FLU/RSV plus assay is intended as an aid in the diagnosis of influenza from Nasopharyngeal swab specimens and should not be used as a sole basis for treatment. Nasal washings and aspirates are unacceptable for Xpert Xpress SARS-CoV-2/FLU/RSV testing.  Fact Sheet for Patients: bloggercourse.com  Fact  Sheet for Healthcare Providers: seriousbroker.it  This test is not yet approved or cleared by the [place]  FDA and has been authorized for detection and/or diagnosis of SARS-CoV-2 by FDA under an Emergency Use Authorization (EUA). This EUA will remain in effect (meaning this test can be used) for the duration of the COVID-19 declaration under Section 564(b)(1) of the Act, 21 U.S.C. section 360bbb-3(b)(1), unless the authorization is terminated or revoked.     Resp Syncytial Virus by PCR NEGATIVE NEGATIVE Final    Comment: (NOTE) Fact Sheet for Patients: bloggercourse.com  Fact Sheet for Healthcare Providers: seriousbroker.it  This test is not yet approved or cleared by the [place]  FDA and has been authorized for detection and/or diagnosis of SARS-CoV-2 by FDA under an Emergency Use Authorization (EUA). This EUA will remain in effect (meaning this test can be used) for the duration of the COVID-19 declaration under Section 564(b)(1) of the Act, 21 U.S.C. section 360bbb-3(b)(1), unless the authorization is terminated or revoked.  Performed at [facility], [address] [place], [place] [address]       Radiology Studies: ECHOCARDIOGRAM COMPLETE Result Date:  07/22/2024    ECHOCARDIOGRAM REPORT   Patient Name:   BRYLEE MCGREAL Date of Exam: 07/22/2024 Medical Rec #:  [id number]          Height:       68.0 in Accession #:    [id number]         Weight:       167.5 lb Date of Birth:  December 11, 1928          BSA:          1.896 m Patient Age:    89 years           BP:           156/56 mmHg Patient Gender: M                  HR:           55 bpm. Exam Location:  Inpatient Procedure: 2D Echo, Color Doppler and Cardiac Doppler (Both Spectral and Color            Flow Doppler were utilized during procedure). Indications:    Dyspnea R06.00  History:        Patient has no prior history of Echocardiogram examinations.                 CAD; Risk Factors:Hypertension and Dyslipidemia.  Sonographer:    Tinnie Gosling RDCS Referring Phys: [phone] PROSPER M AMPONSAH IMPRESSIONS  1. Left ventricular ejection fraction, by estimation, is 60 to 65%. The left ventricle has normal function. The left ventricle has no regional wall motion abnormalities. Left ventricular diastolic parameters are consistent with Grade II diastolic dysfunction (pseudonormalization).  2. Right ventricular systolic function is normal. The right ventricular size is normal. Tricuspid regurgitation signal is inadequate for assessing PA pressure.  3. The mitral valve is grossly normal. Trivial mitral valve regurgitation. No evidence of mitral stenosis.  4. The aortic valve is grossly normal. Aortic valve regurgitation is mild. No aortic stenosis is present.  5. The inferior vena cava is dilated in size with >50% respiratory variability, suggesting right atrial pressure of 8 mmHg. FINDINGS  Left Ventricle: Left ventricular ejection fraction, by estimation, is 60 to 65%. The left ventricle has normal function. The left ventricle has no regional wall motion abnormalities. The left ventricular internal cavity size was normal in size.  There is  no left ventricular hypertrophy. Left ventricular diastolic parameters are consistent  with Grade II diastolic dysfunction (pseudonormalization). Right Ventricle: The right ventricular size is normal. No increase in right ventricular wall thickness. Right ventricular systolic function is normal. Tricuspid regurgitation signal is inadequate for assessing PA pressure. Left Atrium: Left atrial size was normal in size. Right Atrium: Right atrial size was normal in size. Pericardium: Trivial pericardial effusion is present. Mitral Valve: The mitral valve is grossly normal. Trivial mitral valve regurgitation. No evidence of mitral valve stenosis. Tricuspid Valve: The tricuspid valve is grossly normal. Tricuspid valve regurgitation is trivial. No evidence of tricuspid stenosis. Aortic Valve: The aortic valve is grossly normal. Aortic valve regurgitation is mild. No aortic stenosis is present. Pulmonic Valve: The pulmonic valve was grossly normal. Pulmonic valve regurgitation is trivial. No evidence of pulmonic stenosis. Aorta: The aortic root and ascending aorta are structurally normal, with no evidence of dilitation. Venous: The inferior vena cava is dilated in size with greater than 50% respiratory variability, suggesting right atrial pressure of 8 mmHg. IAS/Shunts: The atrial septum is grossly normal.  LEFT VENTRICLE PLAX 2D LVIDd:         5.10 cm      Diastology LVIDs:         2.90 cm      LV e' medial:    4.79 cm/s LV PW:         1.20 cm      LV E/e' medial:  18.8 LV IVS:        1.00 cm      LV e' lateral:   3.70 cm/s LVOT diam:     2.20 cm      LV E/e' lateral: 24.4 LV SV:         94 LV SV Index:   50 LVOT Area:     3.80 cm LV IVRT:       155 msec  LV Volumes (MOD) LV vol d, MOD A2C: 107.0 ml LV vol d, MOD A4C: 93.2 ml LV vol s, MOD A2C: 33.4 ml LV vol s, MOD A4C: 22.2 ml LV SV MOD A2C:     73.6 ml LV SV MOD A4C:     93.2 ml LV SV MOD BP:      76.1 ml RIGHT VENTRICLE             IVC RV S prime:     20.20 cm/s  IVC diam: 2.20 cm TAPSE (M-mode): 2.4 cm LEFT ATRIUM             Index        RIGHT ATRIUM            Index LA diam:        4.00 cm 2.11 cm/m   RA Area:     16.30 cm LA Vol (A2C):   69.7 ml 36.77 ml/m  RA Volume:   42.10 ml  22.21 ml/m LA Vol (A4C):   32.8 ml 17.30 ml/m LA Biplane Vol: 49.3 ml 26.01 ml/m  AORTIC VALVE LVOT Vmax:   104.00 cm/s LVOT Vmean:  67.000 cm/s LVOT VTI:    0.247 m  AORTA Ao Asc diam: 3.20 cm MITRAL VALVE MV Area (PHT): 4.21 cm     SHUNTS MV Decel Time: 180 msec     Systemic VTI:  0.25 m MV E velocity: 90.10 cm/s   Systemic Diam: 2.20 cm MV A velocity: 105.00 cm/s MV E/A ratio:  0.86 [employer]  MD Electronically signed by Darryle Decent MD Signature Date/Time: 07/22/2024/12:16:26 PM    Final    CT Angio Chest PE W and/or Wo Contrast Result Date: 07/21/2024 EXAM: CTA of the Chest with contrast for PE 07/21/2024 03:59:00 PM TECHNIQUE: CTA of the chest was performed after the administration of intravenous contrast. Multiplanar reformatted images are provided for review. MIP images are provided for review. Automated exposure control, iterative reconstruction, and/or weight based adjustment of the mA/kV was utilized to reduce the radiation dose to as low as reasonably achievable. COMPARISON: 04/15/2013 CLINICAL HISTORY: Pulmonary embolism (PE) suspected, high prob. FINDINGS: PULMONARY ARTERIES: Pulmonary arteries are adequately opacified for evaluation. No pulmonary embolism. Main pulmonary artery is normal in caliber. MEDIASTINUM: The heart demonstrates mild cardiomegaly and dense multivessel coronary atherosclerosis. The pericardium demonstrates no acute abnormality. Diffuse mixed density calcified atherosclerosis throughout the aorta and great vessels. Slight enlargement of a left thyroid  lobe nodule with substernal extension, measuring 4.5 cm (previously 3.8 cm). LYMPH NODES: No mediastinal, hilar or axillary lymphadenopathy. LUNGS AND PLEURA: Trace bilateral pleural effusions with bibasilar compressive atelectasis. Scattered atelectasis also noted in the lung bases. Mild  centrilobular emphysema. Small pneumatocele in the right lower lobe. 2 mm micronodule in the left upper lobe (axial 34). A couple of adjacent 2 to 3 mm nodules noted in the lateral right upper lobe (axial 70). 3 mm subperiosteal nodule in the anterior right upper lobe (axial 81). No focal consolidation or pulmonary edema. No pneumothorax. UPPER ABDOMEN: Limited images of the upper abdomen are unremarkable. SOFT TISSUES AND BONES: Soft tissue nodularity in the subcutaneous tissues just anterior to the proximal left humerus (axial 29), measuring 11 x 30 mm, possibly reflecting subcutaneous contusion or hematoma. Thoracic dish. Multilevel degenerative disc disease in the spine. No acute bone abnormality. IMPRESSION: 1. No pulmonary embolism. 2. Trace bilateral pleural effusions with bibasilar compressive atelectasis. 3. Soft tissue nodularity in the subcutaneous tissues just anterior to the proximal left humerus (axial 29), measuring 11 x 30 mm, possibly reflecting subcutaneous contusion or hematoma. Correlation with physical exam findings requested. 4. A few scattered 2 to 3 mm nodules noted in the lungs, as described above, are likely infectious or inflammatory. Electronically signed by: Rogelia Myers MD MD 07/21/2024 05:27 PM EST RP Workstation: [id number]   DG Chest 1 View Result Date: 07/21/2024 EXAM: 1 VIEW(S) XRAY OF THE CHEST 07/21/2024 11:57:00 AM COMPARISON: 12/31/21. CLINICAL HISTORY: SOB (shortness of breath) FINDINGS: LUNGS AND PLEURA: No focal pulmonary opacity. No pleural effusion. No pneumothorax. HEART AND MEDIASTINUM: Mild cardiomegaly. BONES AND SOFT TISSUES: No acute osseous abnormality. IMPRESSION: 1. Mild cardiomegaly. 2. No acute findings. Electronically signed by: Waddell Calk MD MD 07/21/2024 01:43 PM EST RP Workstation: [id number]       LOS: 2 days   Joette Pebbles  [facility] Pager on [email or web address]  07/23/2024, 10:49 AM   "

## 2024-07-23 NOTE — Evaluation (Signed)
 Physical Therapy Evaluation Patient Details Name: [name] MRN: [id number] DOB: [birth year] Today's Date: 07/23/2024  History of Present Illness  Pt is a 89 y.o. M presenting to [facility] on 07/21/24 with SOB and cough. Pt admitted with CHF exacerbation and acute bronchitis from influenza A. PMH is significant for HTN, HLD, HF, and CAD.  Clinical Impression  Prior to admittance, pt was mobilizing mod I using SPC for all mobility and was mod I for ADLs. Pt presents to evaluation with deficits in mobility, strength, power, activity tolerance, and pain. Pt was able to ambulate short room level distances with AD and no physical assistance. Pt has chronic pain that can often limit his ability to ambulate greater than 5 mins at baseline. PT will continue to treat pt while he is admitted. Recommending HHPT at discharge to address remaining mobility deficits and optimize return to PLOF.         If plan is discharge home, recommend the following: A little help with walking and/or transfers;A little help with bathing/dressing/bathroom;Assist for transportation   Can travel by private vehicle        Equipment Recommendations None recommended by PT  Recommendations for Other Services       Functional Status Assessment Patient has had a recent decline in their functional status and demonstrates the ability to make significant improvements in function in a reasonable and predictable amount of time.     Precautions / Restrictions Precautions Precautions: Fall Recall of Precautions/Restrictions: Intact Restrictions Weight Bearing Restrictions Per Provider Order: No      Mobility  Bed Mobility Overal bed mobility: Modified Independent             General bed mobility comments: Pt able to advance BLEs towards EOB and then pull to sit via R bed railing. Increased time to complete.    Transfers Overall transfer level: Needs assistance Equipment used: Rolling walker (2  wheels) Transfers: Sit to/from Stand Sit to Stand: Contact guard assist           General transfer comment: STS from EOB with RW and no physical assistance. Pt noted to be bracing BLEs on bed to assist with stabilizing. Increased time to complete    Ambulation/Gait Ambulation/Gait assistance: Contact guard assist Gait Distance (Feet): 25 Feet Assistive device: Rolling walker (2 wheels) Gait Pattern/deviations: Step-through pattern, Decreased stride length, Trunk flexed Gait velocity: reduced Gait velocity interpretation: <1.8 ft/sec, indicate of risk for recurrent falls   General Gait Details: Pt demonstrates reciprocal gait pattern with increased reliance on RW. Pt demonstrates anterior trunk lean throughout.  Stairs            Wheelchair Mobility     Tilt Bed    Modified Rankin (Stroke Patients Only)       Balance Overall balance assessment: Needs assistance Sitting-balance support: No upper extremity supported, Feet supported Sitting balance-Leahy Scale: Good Sitting balance - Comments: seated EOB   Standing balance support: Bilateral upper extremity supported, During functional activity, Reliant on assistive device for balance Standing balance-Leahy Scale: Fair Standing balance comment: reliant on external support for improved stability                             Pertinent Vitals/Pain Pain Assessment Pain Assessment: Faces Faces Pain Scale: Hurts little more Pain Location: neck, L thigh Pain Descriptors / Indicators: Discomfort, Grimacing, Guarding, Sore Pain Intervention(s): Limited activity within patient's tolerance, Monitored during session  Home Living Family/patient expects to be discharged to:: Private residence Living Arrangements: Alone Available Help at Discharge: Family;Available PRN/intermittently Type of Home: House Home Access: Level entry       Home Layout: One level Home Equipment: Cane - single Librarian, Academic  (2 wheels);Tub bench      Prior Function Prior Level of Function : Independent/Modified Independent             Mobility Comments: mod I using SPC for all mobility ADLs Comments: mod I. reports using tub bench when performing showers, and pt's son picks him up twice per week to get ouf of the house     Extremity/Trunk Assessment   Upper Extremity Assessment Upper Extremity Assessment: Generalized weakness    Lower Extremity Assessment Lower Extremity Assessment: Generalized weakness    Cervical / Trunk Assessment Cervical / Trunk Assessment: Kyphotic;Neck Surgery (prior neck fusion)  Communication   Communication Communication: Impaired Factors Affecting Communication: Hearing impaired    Cognition Arousal: Alert Behavior During Therapy: WFL for tasks assessed/performed   PT - Cognitive impairments: No apparent impairments                         Following commands: Intact       Cueing Cueing Techniques: Verbal cues, Gestural cues     General Comments General comments (skin integrity, edema, etc.): Trialed 1L with SpO2 reading 87%, bumped up to 2L with SpO2 reading 90%    Exercises     Assessment/Plan    PT Assessment Patient needs continued PT services  PT Problem List Decreased strength;Decreased activity tolerance;Decreased balance;Decreased mobility;Decreased knowledge of use of DME;Pain       PT Treatment Interventions DME instruction;Gait training;Functional mobility training;Therapeutic activities;Therapeutic exercise;Balance training;Patient/family education;Wheelchair mobility training;Manual techniques;Modalities    PT Goals (Current goals can be found in the Care Plan section)  Acute Rehab PT Goals Patient Stated Goal: to go home PT Goal Formulation: With patient Time For Goal Achievement: 08/06/24 Potential to Achieve Goals: Good    Frequency Min 2X/week     Co-evaluation               AM-PAC PT 6 Clicks Mobility   Outcome Measure Help needed turning from your back to your side while in a flat bed without using bedrails?: None Help needed moving from lying on your back to sitting on the side of a flat bed without using bedrails?: None Help needed moving to and from a bed to a chair (including a wheelchair)?: A Little Help needed standing up from a chair using your arms (e.g., wheelchair or bedside chair)?: A Little Help needed to walk in hospital room?: A Little Help needed climbing 3-5 steps with a railing? : Total 6 Click Score: 18    End of Session Equipment Utilized During Treatment: Gait belt;Oxygen Activity Tolerance: Patient tolerated treatment well Patient left: in bed;with call bell/phone within reach Nurse Communication: Mobility status PT Visit Diagnosis: Unsteadiness on feet (R26.81);Other abnormalities of gait and mobility (R26.89);Muscle weakness (generalized) (M62.81);Pain Pain - Right/Left: Left Pain - part of body: Hip    Time: 8953-8875 PT Time Calculation (min) (ACUTE ONLY): 38 min   Charges:   PT Evaluation $PT Eval Moderate Complexity: 1 Mod   PT General Charges $$ ACUTE PT VISIT: 1 Visit         Leontine Hilt DPT Acute Rehab Services [phone] Prefer contact via chat   Leontine NOVAK Jennine Peddy 07/23/2024, 11:50 AM

## 2024-07-23 NOTE — Plan of Care (Signed)

## 2024-07-24 DIAGNOSIS — J101 Influenza due to other identified influenza virus with other respiratory manifestations: Secondary | ICD-10-CM

## 2024-07-24 DIAGNOSIS — J208 Acute bronchitis due to other specified organisms: Secondary | ICD-10-CM | POA: Diagnosis not present

## 2024-07-24 DIAGNOSIS — J9601 Acute respiratory failure with hypoxia: Secondary | ICD-10-CM | POA: Diagnosis not present

## 2024-07-24 LAB — BASIC METABOLIC PANEL WITH GFR
Anion gap: 8 (ref 5–15)
BUN: 48 mg/dL — ABNORMAL HIGH (ref 8–23)
CO2: 31 mmol/L (ref 22–32)
Calcium: 8.4 mg/dL — ABNORMAL LOW (ref 8.9–10.3)
Chloride: 103 mmol/L (ref 98–111)
Creatinine, Ser: 1.36 mg/dL — ABNORMAL HIGH (ref 0.61–1.24)
GFR, Estimated: 49 mL/min — ABNORMAL LOW
Glucose, Bld: 93 mg/dL (ref 70–99)
Potassium: 3.9 mmol/L (ref 3.5–5.1)
Sodium: 142 mmol/L (ref 135–145)

## 2024-07-24 MED ORDER — METOPROLOL TARTRATE 25 MG PO TABS
25.0000 mg | ORAL_TABLET | Freq: Two times a day (BID) | ORAL | Status: DC
  Administered 2024-07-25 – 2024-07-26 (×2): 25 mg via ORAL
  Filled 2024-07-24 (×3): qty 1

## 2024-07-24 MED ORDER — POTASSIUM CHLORIDE CRYS ER 20 MEQ PO TBCR
40.0000 meq | EXTENDED_RELEASE_TABLET | Freq: Once | ORAL | Status: AC
  Administered 2024-07-24: 40 meq via ORAL
  Filled 2024-07-24: qty 2

## 2024-07-24 MED ORDER — FUROSEMIDE 20 MG PO TABS
20.0000 mg | ORAL_TABLET | Freq: Every day | ORAL | Status: DC
  Administered 2024-07-25 – 2024-07-28 (×4): 20 mg via ORAL
  Filled 2024-07-24 (×4): qty 1

## 2024-07-24 MED ORDER — DOXAZOSIN MESYLATE 2 MG PO TABS
2.0000 mg | ORAL_TABLET | Freq: Every day | ORAL | Status: DC
  Administered 2024-07-25 – 2024-07-26 (×2): 2 mg via ORAL
  Filled 2024-07-24 (×2): qty 1

## 2024-07-24 MED ORDER — SENNOSIDES-DOCUSATE SODIUM 8.6-50 MG PO TABS
2.0000 | ORAL_TABLET | Freq: Two times a day (BID) | ORAL | Status: DC
  Administered 2024-07-24 – 2024-07-28 (×5): 2 via ORAL
  Filled 2024-07-24 (×7): qty 2

## 2024-07-24 MED ORDER — LOSARTAN POTASSIUM 25 MG PO TABS: 25.0000 mg | ORAL_TABLET | Freq: Every day | ORAL | Status: DC

## 2024-07-24 NOTE — Plan of Care (Signed)

## 2024-07-24 NOTE — Progress Notes (Signed)
 "  [facility] PROGRESS NOTE   [name] FMW:[id number] DOB: 12/19/1928 DOA: 07/21/2024  PCP: Gib Charleston, MD  Brief History: 89 y.o. male with medical history significant for HTN, HLD, HFpEF, CAD s/p PCI and splenic laceration who presented to the ED for evaluation of shortness of breath and cough.  Patient tested positive for influenza.  Was also thought to have diastolic CHF.  Patient was hospitalized for further management.     Consultants: None  Procedures: None    Subjective/Interval History: Patient mentioned that he feels well.  Denies any chest pain or shortness of breath.  No nausea vomiting.  Has not had a bowel movement in a few days.  Denies any abdominal pain.    Assessment/Plan:  Acute respiratory failure with hypoxia Patient presented with progressive shortness of breath found to be hypoxic requiring up to 4 L [place] This is in the setting of CHF exacerbation and acute bronchitis from influenza A infection. Noted to be on 3 L of oxygen via nasal cannula. Continue to wean down oxygen to maintain saturations greater than 90%.  Seems to be stabilizing from a respiratory standpoint.  Steroids changed to oral.  No wheezing appreciated this morning.  Acute on chronic diastolic HF Presented with progressive shortness of breath and leg swelling Stress test in 2017 showed EF of 57%, no documented echo ProBNP elevated to ~2000, patient mildly overloaded on exam Patient was started on IV furosemide . Strict ins and outs and daily weights.  Monitor renal function and electrolytes closely. Patient also noted to be on metoprolol  and ARB. Echocardiogram was done which showed normal LVEF but did show grade 2 diastolic dysfunction. Will change to oral furosemide  after today's dose of IV. Patient underwent CT angiogram which did not show any PE.  Trace bilateral pleural effusions were noted.   Influenza A infection/Acute bronchitis - Patient presented with 2-3 days of  shortness of breath, cough and rhinorrhea - Respiratory panel positive for influenza A - As needed Robitussin for cough and as needed DuoNeb for SOB/wheezing - Droplet precautions Stable for the most part.  Will complete course of Tamiflu  tomorrow.  Will plan for quick taper of prednisone .   Essential hypertension Noted to be on Lopressor , HCTZ, losartan  and Cardura .  Hold HCTZ while he is getting furosemide  Occasional high readings noted.  Would not be too aggressive with blood pressure control.   CKD 3B - Creatinine of 1.37, around baseline of 1.3-1.5 - Trend renal function and avoid nephrotoxic agents  Constipation Start bowel regimen.  DVT Prophylaxis: Lovenox  Code Status: DNR. Family Communication: No family at bedside Disposition Plan: Seen by PT.  Home health is recommended.    Medications: Scheduled:  doxazosin   4 mg Oral Daily   enoxaparin  (LOVENOX ) injection  40 mg Subcutaneous Q24H   furosemide   40 mg Intravenous Daily   latanoprost   1 drop Both Eyes QHS   losartan   50 mg Oral Daily   metoprolol  tartrate  25 mg Oral BID   multivitamin with minerals  1 tablet Oral Daily   oseltamivir   30 mg Oral BID   pneumococcal 20-valent conjugate vaccine  0.5 mL Intramuscular Tomorrow-1000   polyethylene glycol  17 g Oral BID   predniSONE   40 mg Oral Q breakfast   senna-docusate  2 tablet Oral BID   Continuous: PRN:acetaminophen  **OR** acetaminophen , bisacodyl , guaiFENesin , ipratropium-albuterol , ondansetron  **OR** ondansetron  (ZOFRAN ) IV  Antibiotics: Anti-infectives (From admission, onward)    Start     Dose/Rate Route Frequency  Ordered Stop   07/21/24 2215  oseltamivir  (TAMIFLU ) capsule 30 mg        30 mg Oral 2 times daily 07/21/24 2209 07/26/24 2159       Objective:  Vital Signs  Vitals:   07/23/24 2048 07/23/24 2347 07/24/24 0341 07/24/24 0751  BP: (!) 142/54 (!) 107/51 (!) 130/58 (!) 114/48  Pulse: (!) 58 (!) 55 60 (!) 54  Resp: 18 18 18 15   Temp: 98.2  F (36.8 C) 98 F (36.7 C) 97.9 F (36.6 C) 97.8 F (36.6 C)  TempSrc: Oral Oral Oral Oral  SpO2: 97% 90% 95% 96%  Weight:   74.5 kg   Height:        Intake/Output Summary (Last 24 hours) at 07/24/2024 1015 Last data filed at 07/24/2024 0342 Gross per 24 hour  Intake 240 ml  Output 1200 ml  Net -960 ml   Filed Weights   07/22/24 1214 07/23/24 0351 07/24/24 0341  Weight: 73.4 kg 73.9 kg 74.5 kg    General appearance: Awake alert.  In no distress Resp: Normal effort at rest today.  Coarse breath sounds bilaterally but no wheezing or rhonchi appreciated.  No definite crackles. Cardio: S1-S2 is normal regular.  No S3-S4.  No rubs murmurs or bruit GI: Abdomen is soft.  Nontender nondistended.  Bowel sounds are present normal.  No masses organomegaly Extremities: No edema.  Full range of motion of lower extremities. No focal neurological deficits.   Lab Results:  Data Reviewed: I have personally reviewed following labs and reports of the imaging studies  CBC: Recent Labs  Lab 07/21/24 1120 07/21/24 1232 07/22/24 1328 07/23/24 0454  WBC  --  8.5 9.4 8.0  NEUTROABS  --  6.9  --   --   HGB 11.6* 11.9* 11.4* 11.0*  HCT 34.0* 36.4* 35.0* 32.9*  MCV  --  98.1 96.4 95.1  PLT  --  145* 146* 140*    Basic Metabolic Panel: Recent Labs  Lab 07/21/24 1120 07/21/24 1232 07/22/24 1328 07/23/24 0454 07/24/24 0438  NA 139 138 138 141 142  K 4.2 3.9 4.0 3.6 3.9  CL  --  99 97* 102 103  CO2  --  27 30 30 31   GLUCOSE  --  109* 115* 91 93  BUN  --  38* 40* 44* 48*  CREATININE  --  1.37* 1.34* 1.31* 1.36*  CALCIUM   --  8.8* 8.7* 8.4* 8.4*    GFR: Estimated Creatinine Clearance: 32.8 mL/min (A) (by C-G formula based on SCr of 1.36 mg/dL (H)).  Liver Function Tests: Recent Labs  Lab 07/21/24 1232 07/23/24 0454  AST 21 28  ALT 17 14  ALKPHOS 100 76  BILITOT 0.6 0.4  PROT 6.8 5.7*  ALBUMIN 4.0 3.4*    BNP (last 3 results) Recent Labs    07/21/24 1232  PROBNP  1,997.0*    Recent Results (from the past 240 hours)  Resp panel by RT-PCR (RSV, Flu A&B, Covid) Anterior Nasal Swab     Status: Abnormal   Collection Time: 07/21/24 12:17 PM   Specimen: Anterior Nasal Swab  Result Value Ref Range Status   SARS Coronavirus 2 by RT PCR NEGATIVE NEGATIVE Final   Influenza A by PCR POSITIVE (A) NEGATIVE Final   Influenza B by PCR NEGATIVE NEGATIVE Final    Comment: (NOTE) The Xpert Xpress SARS-CoV-2/FLU/RSV plus assay is intended as an aid in the diagnosis of influenza from Nasopharyngeal swab specimens and should not be  used as a sole basis for treatment. Nasal washings and aspirates are unacceptable for Xpert Xpress SARS-CoV-2/FLU/RSV testing.  Fact Sheet for Patients: bloggercourse.com  Fact Sheet for Healthcare Providers: seriousbroker.it  This test is not yet approved or cleared by the [place]  FDA and has been authorized for detection and/or diagnosis of SARS-CoV-2 by FDA under an Emergency Use Authorization (EUA). This EUA will remain in effect (meaning this test can be used) for the duration of the COVID-19 declaration under Section 564(b)(1) of the Act, 21 U.S.C. section 360bbb-3(b)(1), unless the authorization is terminated or revoked.     Resp Syncytial Virus by PCR NEGATIVE NEGATIVE Final    Comment: (NOTE) Fact Sheet for Patients: bloggercourse.com  Fact Sheet for Healthcare Providers: seriousbroker.it  This test is not yet approved or cleared by the [place]  FDA and has been authorized for detection and/or diagnosis of SARS-CoV-2 by FDA under an Emergency Use Authorization (EUA). This EUA will remain in effect (meaning this test can be used) for the duration of the COVID-19 declaration under Section 564(b)(1) of the Act, 21 U.S.C. section 360bbb-3(b)(1), unless the authorization is terminated or revoked.  Performed at  [facility], [address], [place], [place] [address]       Radiology Studies: ECHOCARDIOGRAM COMPLETE Result Date: 07/22/2024    ECHOCARDIOGRAM REPORT   Patient Name:   [name] Date of Exam: 07/22/2024 Medical Rec #:  [id number]          Height:       68.0 in Accession #:    [id number]         Weight:       167.5 lb Date of Birth:  January 30, 1929          BSA:          1.896 m Patient Age:    89 years           BP:           156/56 mmHg Patient Gender: M                  HR:           55 bpm. Exam Location:  Inpatient Procedure: 2D Echo, Color Doppler and Cardiac Doppler (Both Spectral and Color            Flow Doppler were utilized during procedure). Indications:    Dyspnea R06.00  History:        Patient has no prior history of Echocardiogram examinations.                 CAD; Risk Factors:Hypertension and Dyslipidemia.  Sonographer:    Tinnie Gosling RDCS Referring Phys: [phone] PROSPER M AMPONSAH IMPRESSIONS  1. Left ventricular ejection fraction, by estimation, is 60 to 65%. The left ventricle has normal function. The left ventricle has no regional wall motion abnormalities. Left ventricular diastolic parameters are consistent with Grade II diastolic dysfunction (pseudonormalization).  2. Right ventricular systolic function is normal. The right ventricular size is normal. Tricuspid regurgitation signal is inadequate for assessing PA pressure.  3. The mitral valve is grossly normal. Trivial mitral valve regurgitation. No evidence of mitral stenosis.  4. The aortic valve is grossly normal. Aortic valve regurgitation is mild. No aortic stenosis is present.  5. The inferior vena cava is dilated in size with >50% respiratory variability, suggesting right atrial pressure of 8 mmHg. FINDINGS  Left Ventricle: Left ventricular ejection fraction, by estimation, is 60 to  65%. The left ventricle has normal function. The left ventricle has no regional wall motion abnormalities. The left ventricular  internal cavity size was normal in size. There is  no left ventricular hypertrophy. Left ventricular diastolic parameters are consistent with Grade II diastolic dysfunction (pseudonormalization). Right Ventricle: The right ventricular size is normal. No increase in right ventricular wall thickness. Right ventricular systolic function is normal. Tricuspid regurgitation signal is inadequate for assessing PA pressure. Left Atrium: Left atrial size was normal in size. Right Atrium: Right atrial size was normal in size. Pericardium: Trivial pericardial effusion is present. Mitral Valve: The mitral valve is grossly normal. Trivial mitral valve regurgitation. No evidence of mitral valve stenosis. Tricuspid Valve: The tricuspid valve is grossly normal. Tricuspid valve regurgitation is trivial. No evidence of tricuspid stenosis. Aortic Valve: The aortic valve is grossly normal. Aortic valve regurgitation is mild. No aortic stenosis is present. Pulmonic Valve: The pulmonic valve was grossly normal. Pulmonic valve regurgitation is trivial. No evidence of pulmonic stenosis. Aorta: The aortic root and ascending aorta are structurally normal, with no evidence of dilitation. Venous: The inferior vena cava is dilated in size with greater than 50% respiratory variability, suggesting right atrial pressure of 8 mmHg. IAS/Shunts: The atrial septum is grossly normal.  LEFT VENTRICLE PLAX 2D LVIDd:         5.10 cm      Diastology LVIDs:         2.90 cm      LV e' medial:    4.79 cm/s LV PW:         1.20 cm      LV E/e' medial:  18.8 LV IVS:        1.00 cm      LV e' lateral:   3.70 cm/s LVOT diam:     2.20 cm      LV E/e' lateral: 24.4 LV SV:         94 LV SV Index:   50 LVOT Area:     3.80 cm LV IVRT:       155 msec  LV Volumes (MOD) LV vol d, MOD A2C: 107.0 ml LV vol d, MOD A4C: 93.2 ml LV vol s, MOD A2C: 33.4 ml LV vol s, MOD A4C: 22.2 ml LV SV MOD A2C:     73.6 ml LV SV MOD A4C:     93.2 ml LV SV MOD BP:      76.1 ml RIGHT VENTRICLE              IVC RV S prime:     20.20 cm/s  IVC diam: 2.20 cm TAPSE (M-mode): 2.4 cm LEFT ATRIUM             Index        RIGHT ATRIUM           Index LA diam:        4.00 cm 2.11 cm/m   RA Area:     16.30 cm LA Vol (A2C):   69.7 ml 36.77 ml/m  RA Volume:   42.10 ml  22.21 ml/m LA Vol (A4C):   32.8 ml 17.30 ml/m LA Biplane Vol: 49.3 ml 26.01 ml/m  AORTIC VALVE LVOT Vmax:   104.00 cm/s LVOT Vmean:  67.000 cm/s LVOT VTI:    0.247 m  AORTA Ao Asc diam: 3.20 cm MITRAL VALVE MV Area (PHT): 4.21 cm     SHUNTS MV Decel Time: 180 msec     Systemic VTI:  0.25 m MV E velocity: 90.10 cm/s   Systemic Diam: 2.20 cm MV A velocity: 105.00 cm/s MV E/A ratio:  0.86 Darryle Decent MD Electronically signed by Darryle Decent MD Signature Date/Time: 07/22/2024/12:16:26 PM    Final        LOS: 3 days   Joette Pebbles  [facility] Pager on [email or web address]  07/24/2024, 10:15 AM   "
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
## 2024-07-24 NOTE — Progress Notes (Signed)
 Mobility Specialist Progress Note;    07/24/24 1200  Mobility  Activity Stood with assistance  Level of Assistance Contact guard assist, steadying assist  Assistive Device None  Activity Response Tolerated well  Mobility Referral Yes  Mobility visit 1 Mobility  Mobility Specialist Start Time (ACUTE ONLY) 1139  Mobility Specialist Stop Time (ACUTE ONLY) 1148  Mobility Specialist Time Calculation (min) (ACUTE ONLY) 9 min   Pt received in chair, agreeable to mobility. Tried to see SPO2 on RA at rest, desated to 84%, O2 flow increased to 3L/min to keep SPO2 above 88%, Attempted to get ambulatory sat note, pt stood to put on robe, CG for STS, and BP read 86/32 (49). RN deferred further mobility. Will f/u as able. Pt left in chair, call bell and personal belonging in reach. Wife and RN in room.   Florine Oak Mobility Specialist Please Neurosurgeon or [employer] [phone]

## 2024-07-25 DIAGNOSIS — J101 Influenza due to other identified influenza virus with other respiratory manifestations: Secondary | ICD-10-CM | POA: Diagnosis not present

## 2024-07-25 DIAGNOSIS — J208 Acute bronchitis due to other specified organisms: Secondary | ICD-10-CM | POA: Diagnosis not present

## 2024-07-25 DIAGNOSIS — J9601 Acute respiratory failure with hypoxia: Secondary | ICD-10-CM | POA: Diagnosis not present

## 2024-07-25 LAB — BASIC METABOLIC PANEL WITH GFR
Anion gap: 8 (ref 5–15)
BUN: 55 mg/dL — ABNORMAL HIGH (ref 8–23)
CO2: 30 mmol/L (ref 22–32)
Calcium: 8.7 mg/dL — ABNORMAL LOW (ref 8.9–10.3)
Chloride: 102 mmol/L (ref 98–111)
Creatinine, Ser: 1.51 mg/dL — ABNORMAL HIGH (ref 0.61–1.24)
GFR, Estimated: 43 mL/min — ABNORMAL LOW
Glucose, Bld: 95 mg/dL (ref 70–99)
Potassium: 4.2 mmol/L (ref 3.5–5.1)
Sodium: 140 mmol/L (ref 135–145)

## 2024-07-25 LAB — MAGNESIUM: Magnesium: 2 mg/dL (ref 1.7–2.4)

## 2024-07-25 MED ORDER — ORAL CARE MOUTH RINSE: 15.0000 mL | OROMUCOSAL | Status: DC | PRN

## 2024-07-25 NOTE — Progress Notes (Signed)
 "  [facility] PROGRESS NOTE   [name] FMW:[id number] DOB: 06/17/1929 DOA: 07/21/2024  PCP: Gib Charleston, MD  Brief History: 89 y.o. male with medical history significant for HTN, HLD, HFpEF, CAD s/p PCI and splenic laceration who presented to the ED for evaluation of shortness of breath and cough.  Patient tested positive for influenza.  Was also thought to have diastolic CHF.  Patient was hospitalized for further management.     Consultants: None  Procedures: None    Subjective/Interval History: Sitting at the side of the bed trying to eat his breakfast.  Denies any new complaints.  No dizziness lightheadedness.  No nausea vomiting.  Had a bowel movement this morning.     Assessment/Plan:  Acute respiratory failure with hypoxia Patient presented with progressive shortness of breath found to be hypoxic requiring up to 4 L [place] This is in the setting of CHF exacerbation and acute bronchitis from influenza A infection. Noted to be on 3 L of oxygen via nasal cannula.  Does not use oxygen at home.  Room air saturations need to be checked before discharge.  May need home oxygen. Steroids were changed to oral.  Occasional wheezing heard this morning.  Acute on chronic diastolic HF Presented with progressive shortness of breath and leg swelling Stress test in 2017 showed EF of 57%, no documented echo ProBNP elevated to ~2000, patient mildly overloaded on exam Patient was started on IV furosemide . Strict ins and outs and daily weights.  Monitor renal function and electrolytes closely. Patient also noted to be on metoprolol  and ARB. Echocardiogram was done which showed normal LVEF but did show grade 2 diastolic dysfunction. Changed over to oral furosemide . Continue with metoprolol .  Dose had to be decreased due to low blood pressures.  Holding his ARB.  Dose of his doxazosin  also decreased. Patient underwent CT angiogram which did not show any PE.  Trace bilateral pleural  effusions were noted.   Influenza A infection/Acute bronchitis - Patient presented with 2-3 days of shortness of breath, cough and rhinorrhea - Respiratory panel positive for influenza A - As needed Robitussin for cough and as needed DuoNeb for SOB/wheezing - Droplet precautions Stable for the most part.  Will complete course of Tamiflu  today.  Will plan for quick taper of prednisone .   Essential hypertension Noted to have low blood pressures yesterday.  He was asymptomatic.  HCTZ discontinued.  His ARB has been discontinued.  Dose of metoprolol  and Cardura  has been decreased.   Will see his blood pressure trends today.  Will also need to see how he does when he works with physical therapy.   CKD 3B Baseline of 1.3-1.5 Anticipate that he will have a higher creatinine level at discharge.  Constipation Continue bowel regimen.  He mentions that he had a bowel movement yesterday and this morning.  DVT Prophylaxis: Lovenox  Code Status: DNR. Family Communication: No family at bedside.  Discussed with his daughter at length yesterday. Disposition Plan: Seen by PT.  Home health is recommended.  He does live alone.  Daughter and other family members plan to help.    Medications: Scheduled:  doxazosin   2 mg Oral Daily   enoxaparin  (LOVENOX ) injection  40 mg Subcutaneous Q24H   furosemide   20 mg Oral Daily   latanoprost   1 drop Both Eyes QHS   metoprolol  tartrate  25 mg Oral BID   multivitamin with minerals  1 tablet Oral Daily   oseltamivir   30 mg Oral BID  pneumococcal 20-valent conjugate vaccine  0.5 mL Intramuscular Tomorrow-1000   polyethylene glycol  17 g Oral BID   senna-docusate  2 tablet Oral BID   Continuous: PRN:acetaminophen  **OR** acetaminophen , bisacodyl , guaiFENesin , ipratropium-albuterol , ondansetron  **OR** ondansetron  (ZOFRAN ) IV  Antibiotics: Anti-infectives (From admission, onward)    Start     Dose/Rate Route Frequency Ordered Stop   07/21/24 2215  oseltamivir   (TAMIFLU ) capsule 30 mg        30 mg Oral 2 times daily 07/21/24 2209 07/26/24 2159       Objective:  Vital Signs  Vitals:   07/24/24 2001 07/25/24 0026 07/25/24 0451 07/25/24 0832  BP: (!) 126/51 119/62 (!) 127/45 (!) 110/45  Pulse: 68 64 63 70  Resp: 19 18 18 18   Temp: 98 F (36.7 C) 98.6 F (37 C) 98.2 F (36.8 C) 98.4 F (36.9 C)  TempSrc: Oral Oral Oral Oral  SpO2: 92% 91% 95%   Weight:   71.2 kg   Height:        Intake/Output Summary (Last 24 hours) at 07/25/2024 1017 Last data filed at 07/25/2024 0835 Gross per 24 hour  Intake 500 ml  Output 650 ml  Net -150 ml   Filed Weights   07/23/24 0351 07/24/24 0341 07/25/24 0451  Weight: 73.9 kg 74.5 kg 71.2 kg    General appearance: Awake alert.  In no distress Resp: Normal effort.  Scattered wheezing bilaterally.  Few crackles at the bases. Cardio: S1-S2 is normal regular.  No S3-S4.  No rubs murmurs or bruit GI: Abdomen is soft.  Nontender nondistended.  Bowel sounds are present normal.  No masses organomegaly Extremities: No edema.  Full range of motion of lower extremities. Neurologic: No focal neurological deficits  Lab Results:  Data Reviewed: I have personally reviewed following labs and reports of the imaging studies  CBC: Recent Labs  Lab 07/21/24 1120 07/21/24 1232 07/22/24 1328 07/23/24 0454  WBC  --  8.5 9.4 8.0  NEUTROABS  --  6.9  --   --   HGB 11.6* 11.9* 11.4* 11.0*  HCT 34.0* 36.4* 35.0* 32.9*  MCV  --  98.1 96.4 95.1  PLT  --  145* 146* 140*    Basic Metabolic Panel: Recent Labs  Lab 07/21/24 1232 07/22/24 1328 07/23/24 0454 07/24/24 0438 07/25/24 0310  NA 138 138 141 142 140  K 3.9 4.0 3.6 3.9 4.2  CL 99 97* 102 103 102  CO2 27 30 30 31 30   GLUCOSE 109* 115* 91 93 95  BUN 38* 40* 44* 48* 55*  CREATININE 1.37* 1.34* 1.31* 1.36* 1.51*  CALCIUM  8.8* 8.7* 8.4* 8.4* 8.7*  MG  --   --   --   --  2.0    GFR: Estimated Creatinine Clearance: 29.6 mL/min (A) (by C-G formula  based on SCr of 1.51 mg/dL (H)).  Liver Function Tests: Recent Labs  Lab 07/21/24 1232 07/23/24 0454  AST 21 28  ALT 17 14  ALKPHOS 100 76  BILITOT 0.6 0.4  PROT 6.8 5.7*  ALBUMIN 4.0 3.4*    BNP (last 3 results) Recent Labs    07/21/24 1232  PROBNP 1,997.0*    Recent Results (from the past 240 hours)  Resp panel by RT-PCR (RSV, Flu A&B, Covid) Anterior Nasal Swab     Status: Abnormal   Collection Time: 07/21/24 12:17 PM   Specimen: Anterior Nasal Swab  Result Value Ref Range Status   SARS Coronavirus 2 by RT PCR NEGATIVE NEGATIVE Final  Influenza A by PCR POSITIVE (A) NEGATIVE Final   Influenza B by PCR NEGATIVE NEGATIVE Final    Comment: (NOTE) The Xpert Xpress SARS-CoV-2/FLU/RSV plus assay is intended as an aid in the diagnosis of influenza from Nasopharyngeal swab specimens and should not be used as a sole basis for treatment. Nasal washings and aspirates are unacceptable for Xpert Xpress SARS-CoV-2/FLU/RSV testing.  Fact Sheet for Patients: bloggercourse.com  Fact Sheet for Healthcare Providers: seriousbroker.it  This test is not yet approved or cleared by the [place]  FDA and has been authorized for detection and/or diagnosis of SARS-CoV-2 by FDA under an Emergency Use Authorization (EUA). This EUA will remain in effect (meaning this test can be used) for the duration of the COVID-19 declaration under Section 564(b)(1) of the Act, 21 U.S.C. section 360bbb-3(b)(1), unless the authorization is terminated or revoked.     Resp Syncytial Virus by PCR NEGATIVE NEGATIVE Final    Comment: (NOTE) Fact Sheet for Patients: bloggercourse.com  Fact Sheet for Healthcare Providers: seriousbroker.it  This test is not yet approved or cleared by the [place]  FDA and has been authorized for detection and/or diagnosis of SARS-CoV-2 by FDA under an Emergency Use  Authorization (EUA). This EUA will remain in effect (meaning this test can be used) for the duration of the COVID-19 declaration under Section 564(b)(1) of the Act, 21 U.S.C. section 360bbb-3(b)(1), unless the authorization is terminated or revoked.  Performed at [facility], [address] [place], [place] [address]       Radiology Studies: No results found.      LOS: 4 days   Aleenah Homen [employer] on [email or web address]  07/25/2024, 10:17 AM   "

## 2024-07-25 NOTE — Progress Notes (Signed)
 Physical Therapy Treatment Patient Details Name: [name] MRN: [id number] DOB: 08/05/1928 Today's Date: 07/25/2024   History of Present Illness Pt is a 89 y.o. M presenting to [facility] on 07/21/24 with SOB and cough. Pt admitted with CHF exacerbation and acute bronchitis from influenza A. PMH is significant for HTN, HLD, HF, and CAD.    PT Comments  Pt asleep upon arrival, however, agreeable to PT session. Pt required MinA for bed mobility and CGA to stand and ambulate 56ft with RW. Pt limiting gait distance due to fatigue after just waking up. Able to perform a few LE exercises before dinner arrived. Assisted with set-up with all needs met. Continue to recommend home with HHPT. Acute PT to follow.     If plan is discharge home, recommend the following: A little help with walking and/or transfers;A little help with bathing/dressing/bathroom;Assist for transportation   Can travel by private vehicle      Yes  Equipment Recommendations  None recommended by PT       Precautions / Restrictions Precautions Precautions: Fall Recall of Precautions/Restrictions: Intact Restrictions Weight Bearing Restrictions Per Provider Order: No     Mobility  Bed Mobility Overal bed mobility: Needs Assistance Bed Mobility: Supine to Sit    Supine to sit: Min assist    General bed mobility comments: MinA for slight assist with trunk raise. Increased time and effort for completion    Transfers Overall transfer level: Needs assistance Equipment used: Rolling walker (2 wheels) Transfers: Sit to/from Stand Sit to Stand: Contact guard assist    General transfer comment: Pt initially attempting to pull with B UE on RW handles. Cueing for hand placement with CGA for safety to rise    Ambulation/Gait Ambulation/Gait assistance: Contact guard assist Gait Distance (Feet): 25 Feet Assistive device: Rolling walker (2 wheels) Gait Pattern/deviations: Step-through pattern, Decreased stride length,  Trunk flexed Gait velocity: reduced    General Gait Details: Steady gait with slow, shuffling steps. Pt limiting gait distance 2/2 fatigue     Balance Overall balance assessment: Needs assistance Sitting-balance support: No upper extremity supported, Feet supported Sitting balance-Leahy Scale: Good     Standing balance support: Bilateral upper extremity supported, During functional activity, Reliant on assistive device for balance Standing balance-Leahy Scale: Poor Standing balance comment: reliant on UE support     Communication Communication Communication: Impaired Factors Affecting Communication: Hearing impaired  Cognition Arousal: Alert Behavior During Therapy: WFL for tasks assessed/performed   PT - Cognitive impairments: No apparent impairments    Following commands: Intact      Cueing Cueing Techniques: Verbal cues, Gestural cues  Exercises General Exercises - Lower Extremity Long Arc Quad: AROM, Both, 10 reps, Seated Straight Leg Raises: AROM, Both, 10 reps, Supine Hip Flexion/Marching: AROM, Both, 10 reps, Seated        Pertinent Vitals/Pain Pain Assessment Pain Assessment: Faces Faces Pain Scale: Hurts little more Pain Location: L hip Pain Descriptors / Indicators: Discomfort, Grimacing, Guarding, Sore Pain Intervention(s): Limited activity within patient's tolerance, Monitored during session, Repositioned     PT Goals (current goals can now be found in the care plan section) Acute Rehab PT Goals Patient Stated Goal: to get out of bed PT Goal Formulation: With patient Time For Goal Achievement: 08/06/24 Potential to Achieve Goals: Good Progress towards PT goals: Progressing toward goals    Frequency    Min 2X/week       AM-PAC PT 6 Clicks Mobility   Outcome Measure  Help needed turning  from your back to your side while in a flat bed without using bedrails?: None Help needed moving from lying on your back to sitting on the side of a flat  bed without using bedrails?: A Little Help needed moving to and from a bed to a chair (including a wheelchair)?: A Little Help needed standing up from a chair using your arms (e.g., wheelchair or bedside chair)?: A Little Help needed to walk in hospital room?: A Little Help needed climbing 3-5 steps with a railing? : Total 6 Click Score: 17    End of Session Equipment Utilized During Treatment: Oxygen Activity Tolerance: Patient tolerated treatment well Patient left: in chair;with call bell/phone within reach;with chair alarm set Nurse Communication: Mobility status PT Visit Diagnosis: Unsteadiness on feet (R26.81);Other abnormalities of gait and mobility (R26.89);Muscle weakness (generalized) (M62.81);Pain Pain - Right/Left: Left Pain - part of body: Hip     Time: 8384-8366 PT Time Calculation (min) (ACUTE ONLY): 18 min  Charges:    $Therapeutic Exercise: 8-22 mins PT General Charges $$ ACUTE PT VISIT: 1 Visit                    Kate ORN, PT, DPT Secure Chat Preferred  Rehab Office [phone]  Kate BRAVO Wendolyn 07/25/2024, 4:51 PM

## 2024-07-25 NOTE — Plan of Care (Signed)

## 2024-07-25 NOTE — Care Management Important Message (Signed)
 Important Message  Patient Details  Name: [name] MRN: [id number] Date of Birth: 06/01/1929   Important Message Given:  Yes - Medicare IM     Vonzell Arrie Sharps 07/25/2024, 2:47 PM

## 2024-07-25 NOTE — Care Management Important Message (Signed)
 Important Message  Patient Details  Name: [name] MRN: [id number] Date of Birth: 1928-08-01   Important Message Given:  Yes - Medicare IM     [name] 07/25/2024, 10:41 AM

## 2024-07-26 ENCOUNTER — Inpatient Hospital Stay (HOSPITAL_COMMUNITY)

## 2024-07-26 DIAGNOSIS — J208 Acute bronchitis due to other specified organisms: Secondary | ICD-10-CM | POA: Diagnosis not present

## 2024-07-26 DIAGNOSIS — J101 Influenza due to other identified influenza virus with other respiratory manifestations: Secondary | ICD-10-CM | POA: Diagnosis not present

## 2024-07-26 DIAGNOSIS — J9601 Acute respiratory failure with hypoxia: Secondary | ICD-10-CM | POA: Diagnosis not present

## 2024-07-26 LAB — BASIC METABOLIC PANEL WITH GFR
Anion gap: 7 (ref 5–15)
BUN: 53 mg/dL — ABNORMAL HIGH (ref 8–23)
CO2: 32 mmol/L (ref 22–32)
Calcium: 8.7 mg/dL — ABNORMAL LOW (ref 8.9–10.3)
Chloride: 103 mmol/L (ref 98–111)
Creatinine, Ser: 1.32 mg/dL — ABNORMAL HIGH (ref 0.61–1.24)
GFR, Estimated: 50 mL/min — ABNORMAL LOW
Glucose, Bld: 94 mg/dL (ref 70–99)
Potassium: 4.4 mmol/L (ref 3.5–5.1)
Sodium: 141 mmol/L (ref 135–145)

## 2024-07-26 MED ORDER — GUAIFENESIN ER 600 MG PO TB12
600.0000 mg | ORAL_TABLET | Freq: Two times a day (BID) | ORAL | Status: DC
  Administered 2024-07-26 – 2024-07-28 (×4): 600 mg via ORAL
  Filled 2024-07-26 (×5): qty 1

## 2024-07-26 MED ORDER — AMOXICILLIN-POT CLAVULANATE 875-125 MG PO TABS: 1.0000 | ORAL_TABLET | Freq: Two times a day (BID) | ORAL | Status: DC

## 2024-07-26 MED ORDER — AMOXICILLIN-POT CLAVULANATE 875-125 MG PO TABS
1.0000 | ORAL_TABLET | Freq: Two times a day (BID) | ORAL | Status: DC
  Administered 2024-07-26 – 2024-07-28 (×4): 1 via ORAL
  Filled 2024-07-26 (×4): qty 1

## 2024-07-26 MED ORDER — METOPROLOL TARTRATE 12.5 MG HALF TABLET
12.5000 mg | ORAL_TABLET | Freq: Two times a day (BID) | ORAL | Status: DC
  Administered 2024-07-27 – 2024-07-28 (×3): 12.5 mg via ORAL
  Filled 2024-07-26 (×3): qty 1

## 2024-07-26 MED ORDER — SACCHAROMYCES BOULARDII 250 MG PO CAPS
250.0000 mg | ORAL_CAPSULE | Freq: Two times a day (BID) | ORAL | Status: DC
  Administered 2024-07-26 – 2024-07-28 (×4): 250 mg via ORAL
  Filled 2024-07-26 (×5): qty 1

## 2024-07-26 NOTE — Plan of Care (Signed)
  Problem: Education: Goal: Ability to demonstrate management of disease process will improve Outcome: Progressing Goal: Ability to verbalize understanding of medication therapies will improve Outcome: Progressing Goal: Individualized Educational Video(s) Outcome: Progressing   Problem: Activity: Goal: Capacity to carry out activities will improve Outcome: Progressing   

## 2024-07-26 NOTE — Progress Notes (Signed)
 Mobility Specialist Progress Note:   07/26/24 0936  Mobility  Activity Ambulated with assistance (In room)  Level of Assistance Contact guard assist, steadying assist  Assistive Device Front wheel walker  Distance Ambulated (ft) 25 ft  Activity Response Tolerated well  Mobility Referral Yes  Mobility visit 1 Mobility  Mobility Specialist Start Time (ACUTE ONLY) 0913  Mobility Specialist Stop Time (ACUTE ONLY) 0933  Mobility Specialist Time Calculation (min) (ACUTE ONLY) 20 min   Received pt in bed and agreeable to mobility. Found pt on 1 L/min O2. Pt required MinG for safety. Pt's SPO2 decreased to 86%. Pt had x2 LOB, MS corrected. Pt left in chair with alarm on. Left pt on 2 L/min O2. SPO2 WFL. Personal belongings and call light within reach. All needs met.   Lavanda Pollack Mobility Specialist  Please contact via [employer] or  Rehab Office [phone]

## 2024-07-26 NOTE — Progress Notes (Signed)
 Nurse requested Mobility Specialist to perform oxygen saturation test with pt which includes removing pt from oxygen both at rest and while ambulating.  Below are the results from that testing.     Patient Saturations on Room Air at Rest = spO2 92%  Patient Saturations on Room Air while Ambulating = sp02 86% .  Patient Saturations on 2 Liters of oxygen while Ambulating = sp02 93%  At end of testing pt left in room on 2 Liters of oxygen.  Reported results to nurse.

## 2024-07-26 NOTE — Progress Notes (Signed)
 "  [facility] PROGRESS NOTE   [name] FMW:[id number] DOB: 1928-10-05 DOA: 07/21/2024  PCP: Gib Charleston, MD  Brief History: 89 y.o. male with medical history significant for HTN, HLD, HFpEF, CAD s/p PCI and splenic laceration who presented to the ED for evaluation of shortness of breath and cough.  Patient tested positive for influenza.  Was also thought to have diastolic CHF.  Patient was hospitalized for further management.     Consultants: None  Procedures: None    Subjective/Interval History: Patient complains of feeling congested and coughing more than yesterday.  Denies any shortness of breath at rest.  No chest pain.  No nausea vomiting.    Assessment/Plan:  Acute respiratory failure with hypoxia Patient presented with progressive shortness of breath found to be hypoxic requiring up to 4 L  This is in the setting of CHF exacerbation and acute bronchitis from influenza A infection. Noted to be on 3 L of oxygen via nasal cannula.  Does not use oxygen at home.   He did ambulate and had drop in saturations this morning.  Home oxygen ordered.   Feeling more congested today.  Will repeat chest x-ray.  Mucinex .  Flutter valve.  Discussed with nursing staff.   Wheezing has subsided.  Acute on chronic diastolic HF Presented with progressive shortness of breath and leg swelling Stress test in 2017 showed EF of 57%, no documented echo ProBNP elevated to ~2000, patient mildly overloaded on exam Patient was started on IV furosemide . Strict ins and outs and daily weights.  Monitor renal function and electrolytes closely. Patient also noted to be on metoprolol  and ARB. Echocardiogram was done which showed normal LVEF but did show grade 2 diastolic dysfunction. Changed over to oral furosemide . Continue with metoprolol .  Dose had to be decreased due to low blood pressures.  Holding his ARB.  Dose of his doxazosin  also decreased. Patient underwent CT angiogram which  did not show any PE.  Trace bilateral pleural effusions were noted.   Influenza A infection/Acute bronchitis - Patient presented with 2-3 days of shortness of breath, cough and rhinorrhea - Respiratory panel positive for influenza A - As needed Robitussin for cough and as needed DuoNeb for SOB/wheezing - Droplet precautions Patient has completed course of Tamiflu .  Tapered off prednisone .   Essential hypertension Noted to have low blood pressures on 1/15.  He was asymptomatic.  HCTZ discontinued.  His ARB has been discontinued.  Dose of metoprolol  and Cardura  has been decreased.   Blood pressure trends have improved.  Continue to monitor.   CKD 3B Baseline of 1.3-1.5 Anticipate that he will have a higher creatinine level at discharge.  Constipation Has had bowel movements.  Continue bowel regimen.  DVT Prophylaxis: Lovenox  Code Status: DNR. Family Communication: No family at bedside.   Disposition Plan: Seen by PT.  Home health is recommended.  He does live alone.  Daughter and other family members plan to help.  Discussed with daughter over the phone.  Hold off on discharge today as he feels a little worse.    Medications: Scheduled:  doxazosin   2 mg Oral Daily   enoxaparin  (LOVENOX ) injection  40 mg Subcutaneous Q24H   furosemide   20 mg Oral Daily   guaiFENesin   600 mg Oral BID   latanoprost   1 drop Both Eyes QHS   metoprolol  tartrate  25 mg Oral BID   multivitamin with minerals  1 tablet Oral Daily   pneumococcal 20-valent conjugate vaccine  0.5 mL  Intramuscular Tomorrow-1000   polyethylene glycol  17 g Oral BID   senna-docusate  2 tablet Oral BID   Continuous: PRN:acetaminophen  **OR** acetaminophen , bisacodyl , guaiFENesin , ipratropium-albuterol , ondansetron  **OR** ondansetron  (ZOFRAN ) IV, mouth rinse  Antibiotics: Anti-infectives (From admission, onward)    Start     Dose/Rate Route Frequency Ordered Stop   07/21/24 2215  oseltamivir  (TAMIFLU ) capsule 30 mg         30 mg Oral 2 times daily 07/21/24 2209 07/26/24 0919       Objective:  Vital Signs  Vitals:   07/26/24 0200 07/26/24 0401 07/26/24 0611 07/26/24 0756  BP:  (!) 131/45  (!) 139/51  Pulse: (!) 51 (!) 52  64  Resp: (!) 21 12  16   Temp:  98.4 F (36.9 C)  98.3 F (36.8 C)  TempSrc:  Oral  Oral  SpO2: 97% 96%  92%  Weight:   68.3 kg   Height:        Intake/Output Summary (Last 24 hours) at 07/26/2024 1132 Last data filed at 07/26/2024 0612 Gross per 24 hour  Intake 240 ml  Output 450 ml  Net -210 ml   Filed Weights   07/24/24 0341 07/25/24 0451 07/26/24 0611  Weight: 74.5 kg 71.2 kg 68.3 kg    General appearance: Awake alert.  In no distress Resp: Coarse breath sounds with upper airway sounds.  Few crackles at the bases.  No wheezing or rhonchi today. Cardio: S1-S2 is normal regular.  No S3-S4.  No rubs murmurs or bruit GI: Abdomen is soft.  Nontender nondistended.  Bowel sounds are present normal.  No masses organomegaly   Lab Results:  Data Reviewed: I have personally reviewed following labs and reports of the imaging studies  CBC: Recent Labs  Lab 07/21/24 1120 07/21/24 1232 07/22/24 1328 07/23/24 0454  WBC  --  8.5 9.4 8.0  NEUTROABS  --  6.9  --   --   HGB 11.6* 11.9* 11.4* 11.0*  HCT 34.0* 36.4* 35.0* 32.9*  MCV  --  98.1 96.4 95.1  PLT  --  145* 146* 140*    Basic Metabolic Panel: Recent Labs  Lab 07/22/24 1328 07/23/24 0454 07/24/24 0438 07/25/24 0310 07/26/24 0357  NA 138 141 142 140 141  K 4.0 3.6 3.9 4.2 4.4  CL 97* 102 103 102 103  CO2 30 30 31 30  32  GLUCOSE 115* 91 93 95 94  BUN 40* 44* 48* 55* 53*  CREATININE 1.34* 1.31* 1.36* 1.51* 1.32*  CALCIUM  8.7* 8.4* 8.4* 8.7* 8.7*  MG  --   --   --  2.0  --     GFR: Estimated Creatinine Clearance: 33.8 mL/min (A) (by C-G formula based on SCr of 1.32 mg/dL (H)).  Liver Function Tests: Recent Labs  Lab 07/21/24 1232 07/23/24 0454  AST 21 28  ALT 17 14  ALKPHOS 100 76  BILITOT 0.6  0.4  PROT 6.8 5.7*  ALBUMIN 4.0 3.4*    BNP (last 3 results) Recent Labs    07/21/24 1232  PROBNP 1,997.0*    Recent Results (from the past 240 hours)  Resp panel by RT-PCR (RSV, Flu A&B, Covid) Anterior Nasal Swab     Status: Abnormal   Collection Time: 07/21/24 12:17 PM   Specimen: Anterior Nasal Swab  Result Value Ref Range Status   SARS Coronavirus 2 by RT PCR NEGATIVE NEGATIVE Final   Influenza A by PCR POSITIVE (A) NEGATIVE Final   Influenza B by PCR NEGATIVE  NEGATIVE Final    Comment: (NOTE) The Xpert Xpress SARS-CoV-2/FLU/RSV plus assay is intended as an aid in the diagnosis of influenza from Nasopharyngeal swab specimens and should not be used as a sole basis for treatment. Nasal washings and aspirates are unacceptable for Xpert Xpress SARS-CoV-2/FLU/RSV testing.  Fact Sheet for Patients: bloggercourse.com  Fact Sheet for Healthcare Providers: seriousbroker.it  This test is not yet approved or cleared by the [place]  FDA and has been authorized for detection and/or diagnosis of SARS-CoV-2 by FDA under an Emergency Use Authorization (EUA). This EUA will remain in effect (meaning this test can be used) for the duration of the COVID-19 declaration under Section 564(b)(1) of the Act, 21 U.S.C. section 360bbb-3(b)(1), unless the authorization is terminated or revoked.     Resp Syncytial Virus by PCR NEGATIVE NEGATIVE Final    Comment: (NOTE) Fact Sheet for Patients: bloggercourse.com  Fact Sheet for Healthcare Providers: seriousbroker.it  This test is not yet approved or cleared by the [place]  FDA and has been authorized for detection and/or diagnosis of SARS-CoV-2 by FDA under an Emergency Use Authorization (EUA). This EUA will remain in effect (meaning this test can be used) for the duration of the COVID-19 declaration under Section 564(b)(1) of the  Act, 21 U.S.C. section 360bbb-3(b)(1), unless the authorization is terminated or revoked.  Performed at [facility], [address], [place], [place] [address]       Radiology Studies: No results found.      LOS: 5 days   Conlan Miceli [employer] on [email or web address]  07/26/2024, 11:32 AM   "

## 2024-07-27 DIAGNOSIS — J208 Acute bronchitis due to other specified organisms: Secondary | ICD-10-CM | POA: Diagnosis not present

## 2024-07-27 DIAGNOSIS — J101 Influenza due to other identified influenza virus with other respiratory manifestations: Secondary | ICD-10-CM | POA: Diagnosis not present

## 2024-07-27 DIAGNOSIS — J690 Pneumonitis due to inhalation of food and vomit: Secondary | ICD-10-CM

## 2024-07-27 DIAGNOSIS — J9601 Acute respiratory failure with hypoxia: Secondary | ICD-10-CM | POA: Diagnosis not present

## 2024-07-27 LAB — CBC
HCT: 31.4 % — ABNORMAL LOW (ref 39.0–52.0)
Hemoglobin: 10.5 g/dL — ABNORMAL LOW (ref 13.0–17.0)
MCH: 32.1 pg (ref 26.0–34.0)
MCHC: 33.4 g/dL (ref 30.0–36.0)
MCV: 96 fL (ref 80.0–100.0)
Platelets: 137 K/uL — ABNORMAL LOW (ref 150–400)
RBC: 3.27 MIL/uL — ABNORMAL LOW (ref 4.22–5.81)
RDW: 12.7 % (ref 11.5–15.5)
WBC: 9.9 K/uL (ref 4.0–10.5)
nRBC: 0 % (ref 0.0–0.2)

## 2024-07-27 LAB — BASIC METABOLIC PANEL WITH GFR
Anion gap: 8 (ref 5–15)
BUN: 51 mg/dL — ABNORMAL HIGH (ref 8–23)
CO2: 32 mmol/L (ref 22–32)
Calcium: 8.5 mg/dL — ABNORMAL LOW (ref 8.9–10.3)
Chloride: 101 mmol/L (ref 98–111)
Creatinine, Ser: 1.24 mg/dL (ref 0.61–1.24)
GFR, Estimated: 54 mL/min — ABNORMAL LOW
Glucose, Bld: 92 mg/dL (ref 70–99)
Potassium: 4.2 mmol/L (ref 3.5–5.1)
Sodium: 141 mmol/L (ref 135–145)

## 2024-07-27 NOTE — TOC Progression Note (Signed)
 Transition of Care [facility]) - Progression Note    Patient Details  Name: [name] MRN: [id number] Date of Birth: 10/29/1928  Transition of Care [facility]) CM/SW Contact  Graves-Bigelow, Erminio Deems, RN Phone Number: 07/27/2024, 2:59 PM  Clinical Narrative:  ICM spoke with daughter regarding disposition needs. Daughter states patient will benefit from [facility] PT/RN-added Aide for bath. MD is aware to place orders. Hedda is aware of changes for CHF management. ICM called [employer] regarding oxygen needs and placed information in the hub. DME concentrator to be delivered to the home. Patient will need PTAR transport home once discharged. Daughter states either her or her brother will be in the home to receive the patient. No further needs identified at this time.     Expected Discharge Plan: Home w Home Health Services Barriers to Discharge: No Barriers Identified               Expected Discharge Plan and Services In-house Referral: NA Discharge Planning Services: CM Consult Post Acute Care Choice: Home Health Living arrangements for the past 2 months: Single Family Home                 DME Arranged: Oxygen DME Agency: [employer] Date DME Agency Contacted: 07/27/24 Time DME Agency Contacted: [phone] Representative spoke with at DME Agency: hub/ [facility] HH Arranged: RN, Disease Management, PT, Nurse's Aide HH Agency: [facility] Date [facility] Agency Contacted: 07/27/24 Time HH Agency Contacted: [phone] Representative spoke with at [facility] Agency: Hub   Social Drivers of Health (SDOH) Interventions SDOH Screenings   Food Insecurity: No Food Insecurity (07/22/2024)  Housing: Low Risk (07/22/2024)  Transportation Needs: No Transportation Needs (07/22/2024)  Utilities: Not At Risk (07/22/2024)  Social Connections: Moderately Integrated (07/22/2024)  Tobacco Use: Low Risk (07/21/2024)    Readmission Risk Interventions     No data to display

## 2024-07-27 NOTE — Plan of Care (Signed)
  Problem: Activity: Goal: Risk for activity intolerance will decrease Outcome: Progressing   Problem: Nutrition: Goal: Adequate nutrition will be maintained Outcome: Progressing   Problem: Elimination: Goal: Will not experience complications related to bowel motility Outcome: Progressing   Problem: Elimination: Goal: Will not experience complications related to urinary retention Outcome: Progressing   Problem: Safety: Goal: Ability to remain free from injury will improve Outcome: Progressing

## 2024-07-27 NOTE — Evaluation (Signed)
 Clinical/Bedside Swallow Evaluation Patient Details  Name: [name] MRN: [id number] Date of Birth: 02-02-1929  Today's Date: 07/27/2024 Time: SLP Start Time (ACUTE ONLY): 1146 SLP Stop Time (ACUTE ONLY): 1200 SLP Time Calculation (min) (ACUTE ONLY): 14 min  Past Medical History:  Past Medical History:  Diagnosis Date   CAD (coronary artery disease)    Hypercholesteremia    Hyperlipidemia    Hypertension    Spleen injury    Past Surgical History:  Past Surgical History:  Procedure Laterality Date   CHOLECYSTECTOMY     CORONARY STENT PLACEMENT     THYROID  SURGERY     growth removed   HPI:  [name] is a 89 y.o. male who presented to the ED 07/22/23 for evaluation of shortness of breath and cough.  Patient tested positive for influenza.  Was also thought to have diastolic CHF. Worsening cough with development of possible pna.  CXR 1/18: New Right lower lobe airspace opacity concerning for pneumonia.  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 Pt with medical history significant for HTN, HLD, HFpEF, CAD s/p PCI and splenic laceration.    Assessment / Plan / Recommendation  Clinical Impression  Pt presents with normal swallowing as assessed clinically.  Pt tolerated all consistencies trialed, including straw sips of thin liquid, and exhibited good oral clearance of regular solid texture.  Pt has had interval development of RLL pna since admission.  He endorses prior hx pna with most recent being 3 or 4 years ago related to COVID.  Pt with hx esophageal dysphagia with hx dilation, but no symtoms noted today with PO trials.  Pt c/o chronic pain in neck, but only when standing walking, not at present, which makes coughing up secretions/mucus difficult.  He denies hx GERD.  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  He reports neuro and ortho visits, but this documentation could not be found in  EMR.  Pt appears safe to continue current diet.  SLP to monitor for tolerance.  If there is suspicion for ongoing silent aspiration, consider MBS.  Recommend regular texture diet with thin liquidsl SLP Visit Diagnosis: Dysphagia, unspecified (R13.10)    Aspiration Risk  No limitations    Diet Recommendation Regular;Thin liquid    Liquid Administration via: Cup;Straw Medication Administration: Whole meds with liquid Supervision: Patient able to self feed Compensations: Slow rate;Small sips/bites Postural Changes: Seated upright at 90 degrees    Other Recommendations Oral Care Recommendations: Oral care BID     Swallow Evaluation Recommendations  See above   Assistance Recommended at Discharge  N/A  Functional Status Assessment Patient has had a recent decline in their functional status and demonstrates the ability to make significant improvements in function in a reasonable and predictable amount of time.  Frequency and Duration min 2x/week  2 weeks       Prognosis Prognosis for improved oropharyngeal function: Good      Swallow Study   General Date of Onset: 07/24/24 HPI: [name] is a 89 y.o. male who presented to the ED 07/22/23 for evaluation of shortness of breath and cough.  Patient tested positive for influenza.  Was also thought to have diastolic CHF. Worsening cough with development of possible pna.  CXR 1/18: New Right lower lobe airspace opacity concerning for pneumonia.  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  Pt with medical history significant for HTN, HLD, HFpEF, CAD s/p PCI and splenic laceration. Type of Study: Bedside Swallow Evaluation Previous Swallow Assessment: none Diet Prior to this Study: Regular;Thin liquids (Level 0) Temperature Spikes Noted: No Respiratory Status: Nasal cannula History of Recent Intubation: No Behavior/Cognition: Alert;Cooperative;Pleasant mood Oral Cavity Assessment: Within  Functional Limits Oral Care Completed by SLP: No Oral Cavity - Dentition: Adequate natural dentition Self-Feeding Abilities: Able to feed self Patient Positioning: Upright in bed Baseline Vocal Quality: Normal Volitional Cough: Strong Volitional Swallow: Able to elicit    Oral/Motor/Sensory Function Overall Oral Motor/Sensory Function: Mild impairment Facial ROM:  (reduced rounding) Facial Symmetry: Within Functional Limits Lingual ROM: Within Functional Limits Lingual Symmetry: Within Functional Limits Lingual Strength: Reduced Velum: Within Functional Limits Mandible: Within Functional Limits   Ice Chips Ice chips: Not tested   Thin Liquid Thin Liquid: Within functional limits Presentation: Straw    Nectar Thick Nectar Thick Liquid: Not tested   Honey Thick Honey Thick Liquid: Not tested   Puree Puree: Within functional limits Presentation: Spoon   Solid     Solid: Within functional limits Presentation: Self Fed      Anette FORBES Grippe, MA, CCC-SLP Acute Rehabilitation Services Office: [phone] 07/27/2024,12:19 PM

## 2024-07-27 NOTE — Progress Notes (Addendum)
 "  [facility] PROGRESS NOTE   [name] FMW:[id number] DOB: 1928-10-12 DOA: 07/21/2024  PCP: Gib Charleston, MD  Brief History: 89 y.o. male with medical history significant for HTN, HLD, HFpEF, CAD s/p PCI and splenic laceration who presented to the ED for evaluation of shortness of breath and cough.  Patient tested positive for influenza.  Was also thought to have diastolic CHF.  Patient was hospitalized for further management.     Consultants: None  Procedures: None  Subjective/Interval History: Patient mentions that he feels slightly better today compared to yesterday though still has a cough.  Managed to get some sleep at night.  Daughter is at the bedside.  All of her questions were answered.    Assessment/Plan:  Acute respiratory failure with hypoxia Patient presented with progressive shortness of breath found to be hypoxic requiring up to 4 L [place] This is in the setting of CHF exacerbation and acute bronchitis from influenza A infection. Noted to be on 3 L of oxygen via nasal cannula.  Does not use oxygen at home.   Needs home oxygen.  This has been ordered. Wheezing has subsided.  Secondary bacterial pneumonia versus aspiration pneumonia Patient had worsening in his cough over the last 2 days.  Oxygen saturations also noted to be lower than usual.  X-ray was done which revealed right lower lobe infiltrate.  This is either suggestive of a secondary bacterial infection or aspiration pneumonia.  Placed him on Augmentin .   Daughter mentions that patient has had issues with esophageal dysphagia previously requiring esophageal dilatation.  His respiratory status is very tenuous and he is not a candidate for endoscopic evaluation at this time.  There does not appear to be an esophageal component at this time.  Will start up with SLP evaluation.  Acute on chronic diastolic HF Presented with progressive shortness of breath and leg swelling Stress test in 2017 showed EF of  57%, no documented echo ProBNP elevated to ~2000, patient mildly overloaded on exam Patient was started on IV furosemide . Strict ins and outs and daily weights.  Monitor renal function and electrolytes closely. Patient also noted to be on metoprolol  and ARB. Echocardiogram was done which showed normal LVEF but did show grade 2 diastolic dysfunction. Patient underwent CT angiogram which did not show any PE.  Trace bilateral pleural effusions were noted. Changed over to oral furosemide .  Influenza A infection/Acute bronchitis - Patient presented with 2-3 days of shortness of breath, cough and rhinorrhea - Respiratory panel positive for influenza A - As needed Robitussin for cough and as needed DuoNeb for SOB/wheezing - Droplet precautions Patient has completed course of Tamiflu .  Tapered off prednisone .   Essential hypertension Antihypertensive doses had to be adjusted due to low blood pressures.  Blood pressure appears to have stabilized.  Continue current dose for now.  He is currently on furosemide  20 mg daily, metoprolol  12.5 mg twice daily.   Doxazosin , ARB and HCTZ have been discontinued.   Will have to tolerate a high level of blood pressure in this elderly patient.   CKD 3B Baseline of 1.3-1.5 Renal function is at baseline currently.  Constipation Has had bowel movements.  Continue bowel regimen.  DVT Prophylaxis: Lovenox  Code Status: DNR. Family Communication: Long discussion with daughter today. Disposition Plan: Seen by PT.  Home health is recommended.  He does live alone.  Daughter and other family members plan to help.  Needs home oxygen.  Setback due to pneumonia.  Anticipate discharge in  1 to 3 days.    Medications: Scheduled:  amoxicillin -clavulanate  1 tablet Oral Q12H   enoxaparin  (LOVENOX ) injection  40 mg Subcutaneous Q24H   furosemide   20 mg Oral Daily   guaiFENesin   600 mg Oral BID   latanoprost   1 drop Both Eyes QHS   metoprolol  tartrate  12.5 mg Oral BID    multivitamin with minerals  1 tablet Oral Daily   pneumococcal 20-valent conjugate vaccine  0.5 mL Intramuscular Tomorrow-1000   polyethylene glycol  17 g Oral BID   saccharomyces boulardii  250 mg Oral BID   senna-docusate  2 tablet Oral BID   Continuous: PRN:acetaminophen  **OR** acetaminophen , bisacodyl , guaiFENesin , ipratropium-albuterol , ondansetron  **OR** ondansetron  (ZOFRAN ) IV, mouth rinse  Antibiotics: Anti-infectives (From admission, onward)    Start     Dose/Rate Route Frequency Ordered Stop   07/26/24 2200  amoxicillin -clavulanate (AUGMENTIN ) 875-125 MG per tablet 1 tablet  Status:  Discontinued        1 tablet Oral Every 12 hours 07/26/24 1703 07/26/24 1704   07/26/24 1800  amoxicillin -clavulanate (AUGMENTIN ) 875-125 MG per tablet 1 tablet        1 tablet Oral Every 12 hours 07/26/24 1704     07/21/24 2215  oseltamivir  (TAMIFLU ) capsule 30 mg        30 mg Oral 2 times daily 07/21/24 2209 07/26/24 0919       Objective:  Vital Signs  Vitals:   07/26/24 2115 07/27/24 0019 07/27/24 0405 07/27/24 0814  BP: (!) 126/51 (!) 127/42 (!) 151/51 (!) 149/50  Pulse: 67 61 (!) 59 81  Resp: 14 18 (!) 22 17  Temp: 97.8 F (36.6 C)  98.5 F (36.9 C) 98 F (36.7 C)  TempSrc: Oral  Oral Oral  SpO2: 94% 96% 95% 94%  Weight:   72.6 kg   Height:        Intake/Output Summary (Last 24 hours) at 07/27/2024 1005 Last data filed at 07/27/2024 0409 Gross per 24 hour  Intake 720 ml  Output 700 ml  Net 20 ml   Filed Weights   07/25/24 0451 07/26/24 0611 07/27/24 0405  Weight: 71.2 kg 68.3 kg 72.6 kg   General appearance: Awake alert.  In no distress Resp: Improved aeration today.  Fewer crackles.  No wheezing. Cardio: S1-S2 is normal regular.  No S3-S4.  No rubs murmurs or bruit GI: Abdomen is soft.  Nontender nondistended.  Bowel sounds are present normal.  No masses organomegaly Extremities: Moving all of his extremities but physical deconditioning is noted.  Lab  Results:  Data Reviewed: I have personally reviewed following labs and reports of the imaging studies  CBC: Recent Labs  Lab 07/21/24 1120 07/21/24 1232 07/22/24 1328 07/23/24 0454 07/27/24 0318  WBC  --  8.5 9.4 8.0 9.9  NEUTROABS  --  6.9  --   --   --   HGB 11.6* 11.9* 11.4* 11.0* 10.5*  HCT 34.0* 36.4* 35.0* 32.9* 31.4*  MCV  --  98.1 96.4 95.1 96.0  PLT  --  145* 146* 140* 137*    Basic Metabolic Panel: Recent Labs  Lab 07/23/24 0454 07/24/24 0438 07/25/24 0310 07/26/24 0357 07/27/24 0318  NA 141 142 140 141 141  K 3.6 3.9 4.2 4.4 4.2  CL 102 103 102 103 101  CO2 30 31 30  32 32  GLUCOSE 91 93 95 94 92  BUN 44* 48* 55* 53* 51*  CREATININE 1.31* 1.36* 1.51* 1.32* 1.24  CALCIUM  8.4* 8.4* 8.7*  8.7* 8.5*  MG  --   --  2.0  --   --     GFR: Estimated Creatinine Clearance: 36 mL/min (by C-G formula based on SCr of 1.24 mg/dL).  Liver Function Tests: Recent Labs  Lab 07/21/24 1232 07/23/24 0454  AST 21 28  ALT 17 14  ALKPHOS 100 76  BILITOT 0.6 0.4  PROT 6.8 5.7*  ALBUMIN 4.0 3.4*    BNP (last 3 results) Recent Labs    07/21/24 1232  PROBNP 1,997.0*    Recent Results (from the past 240 hours)  Resp panel by RT-PCR (RSV, Flu A&B, Covid) Anterior Nasal Swab     Status: Abnormal   Collection Time: 07/21/24 12:17 PM   Specimen: Anterior Nasal Swab  Result Value Ref Range Status   SARS Coronavirus 2 by RT PCR NEGATIVE NEGATIVE Final   Influenza A by PCR POSITIVE (A) NEGATIVE Final   Influenza B by PCR NEGATIVE NEGATIVE Final    Comment: (NOTE) The Xpert Xpress SARS-CoV-2/FLU/RSV plus assay is intended as an aid in the diagnosis of influenza from Nasopharyngeal swab specimens and should not be used as a sole basis for treatment. Nasal washings and aspirates are unacceptable for Xpert Xpress SARS-CoV-2/FLU/RSV testing.  Fact Sheet for Patients: bloggercourse.com  Fact Sheet for Healthcare  Providers: seriousbroker.it  This test is not yet approved or cleared by the [place]  FDA and has been authorized for detection and/or diagnosis of SARS-CoV-2 by FDA under an Emergency Use Authorization (EUA). This EUA will remain in effect (meaning this test can be used) for the duration of the COVID-19 declaration under Section 564(b)(1) of the Act, 21 U.S.C. section 360bbb-3(b)(1), unless the authorization is terminated or revoked.     Resp Syncytial Virus by PCR NEGATIVE NEGATIVE Final    Comment: (NOTE) Fact Sheet for Patients: bloggercourse.com  Fact Sheet for Healthcare Providers: seriousbroker.it  This test is not yet approved or cleared by the [place]  FDA and has been authorized for detection and/or diagnosis of SARS-CoV-2 by FDA under an Emergency Use Authorization (EUA). This EUA will remain in effect (meaning this test can be used) for the duration of the COVID-19 declaration under Section 564(b)(1) of the Act, 21 U.S.C. section 360bbb-3(b)(1), unless the authorization is terminated or revoked.  Performed at [facility], [address] [place], [place] [address]       Radiology Studies: DG CHEST PORT 1 VIEW Result Date: 07/26/2024 EXAM: 1 VIEW(S) XRAY OF THE CHEST 07/26/2024 12:02:00 PM COMPARISON: 07/21/2024 CLINICAL HISTORY: Cough FINDINGS: LUNGS AND PLEURA: Right lower lobe airspace opacity. Trace right pleural effusion. No pneumothorax. HEART AND MEDIASTINUM: Atherosclerotic plaque noted. Coronary artery stent in place. No acute abnormality of the cardiac and mediastinal silhouettes. BONES AND SOFT TISSUES: No acute osseous abnormality. IMPRESSION: 1. New Right lower lobe airspace opacity  concerning for pneumonia. Electronically signed by: Norleen Boxer MD 07/26/2024 04:23 PM EST RP Workstation: [id number]     LOS: 6 days   Joette Pebbles  [facility] Pager  on [email or web address]  07/27/2024, 10:05 AM   "

## 2024-07-28 ENCOUNTER — Other Ambulatory Visit (HOSPITAL_COMMUNITY): Payer: Self-pay

## 2024-07-28 DIAGNOSIS — J9601 Acute respiratory failure with hypoxia: Secondary | ICD-10-CM | POA: Diagnosis not present

## 2024-07-28 LAB — BASIC METABOLIC PANEL WITH GFR
Anion gap: 9 (ref 5–15)
BUN: 42 mg/dL — ABNORMAL HIGH (ref 8–23)
CO2: 30 mmol/L (ref 22–32)
Calcium: 8.9 mg/dL (ref 8.9–10.3)
Chloride: 101 mmol/L (ref 98–111)
Creatinine, Ser: 1.18 mg/dL (ref 0.61–1.24)
GFR, Estimated: 58 mL/min — ABNORMAL LOW
Glucose, Bld: 91 mg/dL (ref 70–99)
Potassium: 4.3 mmol/L (ref 3.5–5.1)
Sodium: 140 mmol/L (ref 135–145)

## 2024-07-28 MED ORDER — AMOXICILLIN-POT CLAVULANATE 875-125 MG PO TABS
1.0000 | ORAL_TABLET | Freq: Two times a day (BID) | ORAL | 0 refills | Status: AC
  Filled 2024-07-28: qty 12, 6d supply, fill #0

## 2024-07-28 MED ORDER — ACIDOPHILUS 100 MG PO CAPS
100.0000 mg | ORAL_CAPSULE | Freq: Two times a day (BID) | ORAL | 0 refills | Status: AC
  Filled 2024-07-28: qty 30, 15d supply, fill #0

## 2024-07-28 MED ORDER — GUAIFENESIN ER 600 MG PO TB12
600.0000 mg | ORAL_TABLET | Freq: Two times a day (BID) | ORAL | 0 refills | Status: AC
  Filled 2024-07-28: qty 60, 30d supply, fill #0

## 2024-07-28 MED ORDER — SENNOSIDES-DOCUSATE SODIUM 8.6-50 MG PO TABS
2.0000 | ORAL_TABLET | Freq: Two times a day (BID) | ORAL | 0 refills | Status: AC
  Filled 2024-07-28: qty 120, 30d supply, fill #0

## 2024-07-28 MED ORDER — BISACODYL 10 MG RE SUPP
10.0000 mg | Freq: Once | RECTAL | Status: DC
  Filled 2024-07-28: qty 1

## 2024-07-28 NOTE — Progress Notes (Signed)
 Nurse requested Mobility Specialist to perform oxygen saturation test with pt which includes removing pt from oxygen both at rest and while ambulating.  Below are the results from that testing.     Patient Saturations on Room Air at Rest = spO2 99%  Patient Saturations on Room Air while Ambulating = sp02 85% .    Patient Saturations on 2 Liters of oxygen while Ambulating = sp02 92%  At end of testing pt left in room on 2  Liters of oxygen.  Reported results to nurse.    Ricky Janeal MATSU, BS Mobility Specialist Please contact via Special Educational Needs Teacher or [employer] [phone]

## 2024-07-28 NOTE — Discharge Summary (Signed)
 " [facility]  Physician Discharge Summary   Patient ID: [name] MRN: [id number] DOB/AGE: [birth year] 89 y.o.  Admit date: 07/21/2024 Discharge date: 07/28/2024    PCP: Gib Charleston, MD  DISCHARGE DIAGNOSES:    Acute respiratory failure with hypoxia (HCC)   Acute on chronic diastolic HF (heart failure) (HCC)   Acute bronchitis due to other specified organisms   Influenza A   CKD stage 3b, GFR 30-44 ml/min (HCC) Secondary bacterial infection versus aspiration pneumonia Essential hypertension   RECOMMENDATIONS FOR OUTPATIENT FOLLOW UP: Follow-up with PCP in 1 to 2 weeks   Home Health: PT OT RN Equipment/Devices: Home oxygen  CODE STATUS: DNR  DISCHARGE CONDITION: fair  Diet recommendation: As before  INITIAL HISTORY: 89 y.o. male with medical history significant for HTN, HLD, HFpEF, CAD s/p PCI and splenic laceration who presented to the ED for evaluation of shortness of breath and cough.  Patient tested positive for influenza.  Was also thought to have diastolic CHF.  Patient was hospitalized for further management.     HOSPITAL COURSE:   Acute respiratory failure with hypoxia Patient presented with progressive shortness of breath found to be hypoxic requiring up to 4 L [place] This is in the setting of CHF exacerbation and acute bronchitis from influenza A infection. Will need oxygen at home which has been ordered. Respiratory status is stable for the most part.  See below.   Secondary bacterial pneumonia versus aspiration pneumonia Patient had worsening in his cough.  Oxygen saturations also noted to be lower than usual.  X-ray was done which revealed right lower lobe infiltrate.  This is either suggestive of a secondary bacterial infection or aspiration pneumonia.  Placed him on Augmentin .   Daughter mentions that patient has had issues with esophageal dysphagia previously requiring esophageal dilatation.  His respiratory status is very tenuous and he is  not a candidate for endoscopic evaluation at this time.  There does not appear to be an esophageal component at this time.  Seen by SLP.  No concerns for oropharyngeal dysphagia noted at this time.  Continue current diet.  No further testing at this time.   Acute on chronic diastolic HF Presented with progressive shortness of breath and leg swelling Stress test in 2017 showed EF of 57%, no documented echo ProBNP elevated to ~2000, patient mildly overloaded on exam Patient was started on IV furosemide . Strict ins and outs and daily weights.  Monitor renal function and electrolytes closely. Patient also noted to be on metoprolol  and ARB. Echocardiogram was done which showed normal LVEF but did show grade 2 diastolic dysfunction. Patient underwent CT angiogram which did not show any PE.  Trace bilateral pleural effusions were noted. Changed over to oral furosemide .   Influenza A infection/Acute bronchitis - Patient presented with 2-3 days of shortness of breath, cough and rhinorrhea - Respiratory panel positive for influenza A - As needed Robitussin for cough and as needed DuoNeb for SOB/wheezing - Droplet precautions Patient has completed course of Tamiflu .  Tapered off prednisone .   Essential hypertension Antihypertensive doses had to be adjusted due to low blood pressures.  Blood pressure appears to have stabilized.  Continue current dose for now.  He is currently on furosemide  20 mg daily, metoprolol  12.5 mg twice daily.   Doxazosin , ARB and HCTZ have been discontinued.   Will have to tolerate a high level of blood pressure in this elderly patient.   CKD 3B Baseline of 1.3-1.5 Renal function is at baseline  currently.   Constipation Resolved with bowel regimen.  Patient is stable.  Seems to be doing better today compared to yesterday.  Ambulated with mobility team and did much better today.  Discussed with his daughters.  Okay for discharge home today.   PERTINENT LABS:  The  results of significant diagnostics from this hospitalization (including imaging, microbiology, ancillary and laboratory) are listed below for reference.    Microbiology: Recent Results (from the past 240 hours)  Resp panel by RT-PCR (RSV, Flu A&B, Covid) Anterior Nasal Swab     Status: Abnormal   Collection Time: 07/21/24 12:17 PM   Specimen: Anterior Nasal Swab  Result Value Ref Range Status   SARS Coronavirus 2 by RT PCR NEGATIVE NEGATIVE Final   Influenza A by PCR POSITIVE (A) NEGATIVE Final   Influenza B by PCR NEGATIVE NEGATIVE Final    Comment: (NOTE) The Xpert Xpress SARS-CoV-2/FLU/RSV plus assay is intended as an aid in the diagnosis of influenza from Nasopharyngeal swab specimens and should not be used as a sole basis for treatment. Nasal washings and aspirates are unacceptable for Xpert Xpress SARS-CoV-2/FLU/RSV testing.  Fact Sheet for Patients: bloggercourse.com  Fact Sheet for Healthcare Providers: seriousbroker.it  This test is not yet approved or cleared by the [place]  FDA and has been authorized for detection and/or diagnosis of SARS-CoV-2 by FDA under an Emergency Use Authorization (EUA). This EUA will remain in effect (meaning this test can be used) for the duration of the COVID-19 declaration under Section 564(b)(1) of the Act, 21 U.S.C. section 360bbb-3(b)(1), unless the authorization is terminated or revoked.     Resp Syncytial Virus by PCR NEGATIVE NEGATIVE Final    Comment: (NOTE) Fact Sheet for Patients: bloggercourse.com  Fact Sheet for Healthcare Providers: seriousbroker.it  This test is not yet approved or cleared by the [place]  FDA and has been authorized for detection and/or diagnosis of SARS-CoV-2 by FDA under an Emergency Use Authorization (EUA). This EUA will remain in effect (meaning this test can be used) for the duration of  the COVID-19 declaration under Section 564(b)(1) of the Act, 21 U.S.C. section 360bbb-3(b)(1), unless the authorization is terminated or revoked.  Performed at [facility], [address] [place], [place] [address]      Labs:   Basic Metabolic Panel: Recent Labs  Lab 07/24/24 0438 07/25/24 0310 07/26/24 0357 07/27/24 0318 07/28/24 0342  NA 142 140 141 141 140  K 3.9 4.2 4.4 4.2 4.3  CL 103 102 103 101 101  CO2 31 30 32 32 30  GLUCOSE 93 95 94 92 91  BUN 48* 55* 53* 51* 42*  CREATININE 1.36* 1.51* 1.32* 1.24 1.18  CALCIUM  8.4* 8.7* 8.7* 8.5* 8.9  MG  --  2.0  --   --   --    Liver Function Tests: Recent Labs  Lab 07/23/24 0454  AST 28  ALT 14  ALKPHOS 76  BILITOT 0.4  PROT 5.7*  ALBUMIN 3.4*   CBC: Recent Labs  Lab 07/22/24 1328 07/23/24 0454 07/27/24 0318  WBC 9.4 8.0 9.9  HGB 11.4* 11.0* 10.5*  HCT 35.0* 32.9* 31.4*  MCV 96.4 95.1 96.0  PLT 146* 140* 137*    ProBNP (last 3 results) Recent Labs    07/21/24 1232  PROBNP 1,997.0*    IMAGING STUDIES DG CHEST PORT 1 VIEW Result Date: 07/26/2024 EXAM: 1 VIEW(S) XRAY OF THE CHEST 07/26/2024 12:02:00 PM COMPARISON: 07/21/2024 CLINICAL HISTORY: Cough FINDINGS: LUNGS AND PLEURA: Right lower  lobe airspace opacity. Trace right pleural effusion. No pneumothorax. HEART AND MEDIASTINUM: Atherosclerotic plaque noted. Coronary artery stent in place. No acute abnormality of the cardiac and mediastinal silhouettes. BONES AND SOFT TISSUES: No acute osseous abnormality. IMPRESSION: 1. New Right lower lobe airspace opacity  concerning for pneumonia. Electronically signed by: Norleen Boxer MD 07/26/2024 04:23 PM EST RP Workstation: [id number]   ECHOCARDIOGRAM COMPLETE Result Date: 07/22/2024    ECHOCARDIOGRAM REPORT   Patient Name:   [name] Date of Exam: 07/22/2024 Medical Rec #:  [id number]          Height:       68.0 in Accession #:    [id number]         Weight:       167.5 lb Date of Birth:  [birth year]           BSA:          1.896 m Patient Age:    89 years           BP:           156/56 mmHg Patient Gender: M                  HR:           55 bpm. Exam Location:  Inpatient Procedure: 2D Echo, Color Doppler and Cardiac Doppler (Both Spectral and Color            Flow Doppler were utilized during procedure). Indications:    Dyspnea R06.00  History:        Patient has no prior history of Echocardiogram examinations.                 CAD; Risk Factors:Hypertension and Dyslipidemia.  Sonographer:    Tinnie Gosling RDCS Referring Phys: [id number] PROSPER M AMPONSAH IMPRESSIONS  1. Left ventricular ejection fraction, by estimation, is 60 to 65%. The left ventricle has normal function. The left ventricle has no regional wall motion abnormalities. Left ventricular diastolic parameters are consistent with Grade II diastolic dysfunction (pseudonormalization).  2. Right ventricular systolic function is normal. The right ventricular size is normal. Tricuspid regurgitation signal is inadequate for assessing PA pressure.  3. The mitral valve is grossly normal. Trivial mitral valve regurgitation. No evidence of mitral stenosis.  4. The aortic valve is grossly normal. Aortic valve regurgitation is mild. No aortic stenosis is present.  5. The inferior vena cava is dilated in size with >50% respiratory variability, suggesting right atrial pressure of 8 mmHg. FINDINGS  Left Ventricle: Left ventricular ejection fraction, by estimation, is 60 to 65%. The left ventricle has normal function. The left ventricle has no regional wall motion abnormalities. The left ventricular internal cavity size was normal in size. There is  no left ventricular hypertrophy. Left ventricular diastolic parameters are consistent with Grade II diastolic dysfunction (pseudonormalization). Right Ventricle: The right ventricular size is normal. No increase in right ventricular wall thickness. Right ventricular systolic function is normal. Tricuspid regurgitation  signal is inadequate for assessing PA pressure. Left Atrium: Left atrial size was normal in size. Right Atrium: Right atrial size was normal in size. Pericardium: Trivial pericardial effusion is present. Mitral Valve: The mitral valve is grossly normal. Trivial mitral valve regurgitation. No evidence of mitral valve stenosis. Tricuspid Valve: The tricuspid valve is grossly normal. Tricuspid valve regurgitation is trivial. No evidence of tricuspid stenosis. Aortic Valve: The aortic valve is grossly normal. Aortic valve regurgitation is mild. No aortic stenosis is present.  Pulmonic Valve: The pulmonic valve was grossly normal. Pulmonic valve regurgitation is trivial. No evidence of pulmonic stenosis. Aorta: The aortic root and ascending aorta are structurally normal, with no evidence of dilitation. Venous: The inferior vena cava is dilated in size with greater than 50% respiratory variability, suggesting right atrial pressure of 8 mmHg. IAS/Shunts: The atrial septum is grossly normal.  LEFT VENTRICLE PLAX 2D LVIDd:         5.10 cm      Diastology LVIDs:         2.90 cm      LV e' medial:    4.79 cm/s LV PW:         1.20 cm      LV E/e' medial:  18.8 LV IVS:        1.00 cm      LV e' lateral:   3.70 cm/s LVOT diam:     2.20 cm      LV E/e' lateral: 24.4 LV SV:         94 LV SV Index:   50 LVOT Area:     3.80 cm LV IVRT:       155 msec  LV Volumes (MOD) LV vol d, MOD A2C: 107.0 ml LV vol d, MOD A4C: 93.2 ml LV vol s, MOD A2C: 33.4 ml LV vol s, MOD A4C: 22.2 ml LV SV MOD A2C:     73.6 ml LV SV MOD A4C:     93.2 ml LV SV MOD BP:      76.1 ml RIGHT VENTRICLE             IVC RV S prime:     20.20 cm/s  IVC diam: 2.20 cm TAPSE (M-mode): 2.4 cm LEFT ATRIUM             Index        RIGHT ATRIUM           Index LA diam:        4.00 cm 2.11 cm/m   RA Area:     16.30 cm LA Vol (A2C):   69.7 ml 36.77 ml/m  RA Volume:   42.10 ml  22.21 ml/m LA Vol (A4C):   32.8 ml 17.30 ml/m LA Biplane Vol: 49.3 ml 26.01 ml/m  AORTIC VALVE  LVOT Vmax:   104.00 cm/s LVOT Vmean:  67.000 cm/s LVOT VTI:    0.247 m  AORTA Ao Asc diam: 3.20 cm MITRAL VALVE MV Area (PHT): 4.21 cm     SHUNTS MV Decel Time: 180 msec     Systemic VTI:  0.25 m MV E velocity: 90.10 cm/s   Systemic Diam: 2.20 cm MV A velocity: 105.00 cm/s MV E/A ratio:  0.86 Darryle Decent MD Electronically signed by Darryle Decent MD Signature Date/Time: 07/22/2024/12:16:26 PM    Final    CT Angio Chest PE W and/or Wo Contrast Result Date: 07/21/2024 EXAM: CTA of the Chest with contrast for PE 07/21/2024 03:59:00 PM TECHNIQUE: CTA of the chest was performed after the administration of intravenous contrast. Multiplanar reformatted images are provided for review. MIP images are provided for review. Automated exposure control, iterative reconstruction, and/or weight based adjustment of the mA/kV was utilized to reduce the radiation dose to as low as reasonably achievable. COMPARISON: 04/15/2013 CLINICAL HISTORY: Pulmonary embolism (PE) suspected, high prob. FINDINGS: PULMONARY ARTERIES: Pulmonary arteries are adequately opacified for evaluation. No pulmonary embolism. Main pulmonary artery is normal in caliber. MEDIASTINUM: The heart demonstrates mild cardiomegaly  and dense multivessel coronary atherosclerosis. The pericardium demonstrates no acute abnormality. Diffuse mixed density calcified atherosclerosis throughout the aorta and great vessels. Slight enlargement of a left thyroid  lobe nodule with substernal extension, measuring 4.5 cm (previously 3.8 cm). LYMPH NODES: No mediastinal, hilar or axillary lymphadenopathy. LUNGS AND PLEURA: Trace bilateral pleural effusions with bibasilar compressive atelectasis. Scattered atelectasis also noted in the lung bases. Mild centrilobular emphysema. Small pneumatocele in the right lower lobe. 2 mm micronodule in the left upper lobe (axial 34). A couple of adjacent 2 to 3 mm nodules noted in the lateral right upper lobe (axial 70). 3 mm subperiosteal nodule  in the anterior right upper lobe (axial 81). No focal consolidation or pulmonary edema. No pneumothorax. UPPER ABDOMEN: Limited images of the upper abdomen are unremarkable. SOFT TISSUES AND BONES: Soft tissue nodularity in the subcutaneous tissues just anterior to the proximal left humerus (axial 29), measuring 11 x 30 mm, possibly reflecting subcutaneous contusion or hematoma. Thoracic dish. Multilevel degenerative disc disease in the spine. No acute bone abnormality. IMPRESSION: 1. No pulmonary embolism. 2. Trace bilateral pleural effusions with bibasilar compressive atelectasis. 3. Soft tissue nodularity in the subcutaneous tissues just anterior to the proximal left humerus (axial 29), measuring 11 x 30 mm, possibly reflecting subcutaneous contusion or hematoma. Correlation with physical exam findings requested. 4. A few scattered 2 to 3 mm nodules noted in the lungs, as described above, are likely infectious or inflammatory. Electronically signed by: Rogelia Myers MD MD 07/21/2024 05:27 PM EST RP Workstation: [id number]   DG Chest 1 View Result Date: 07/21/2024 EXAM: 1 VIEW(S) XRAY OF THE CHEST 07/21/2024 11:57:00 AM COMPARISON: 12/31/21. CLINICAL HISTORY: SOB (shortness of breath) FINDINGS: LUNGS AND PLEURA: No focal pulmonary opacity. No pleural effusion. No pneumothorax. HEART AND MEDIASTINUM: Mild cardiomegaly. BONES AND SOFT TISSUES: No acute osseous abnormality. IMPRESSION: 1. Mild cardiomegaly. 2. No acute findings. Electronically signed by: Waddell Calk MD MD 07/21/2024 01:43 PM EST RP Workstation: [id number]    DISCHARGE EXAMINATION: See progress note from earlier today   DISPOSITION: Home  Discharge Instructions     Call MD for:  difficulty breathing, headache or visual disturbances   Complete by: As directed    Call MD for:  extreme fatigue   Complete by: As directed    Call MD for:  persistant dizziness or light-headedness   Complete by: As directed    Call MD for:  persistant  nausea and vomiting   Complete by: As directed    Call MD for:  severe uncontrolled pain   Complete by: As directed    Call MD for:  temperature >100.4   Complete by: As directed    Diet general   Complete by: As directed    Discharge instructions   Complete by: As directed    Please take your medications as prescribed.  Follow-up with a primary care provider in 1 to 2 weeks.  Oxygen has been prescribed to you for around-the-clock use.  Hopefully this will be short-term.  You were cared for by a hospitalist during your hospital stay. If you have any questions about your discharge medications or the care you received while you were in the hospital after you are discharged, you can call the unit and asked to speak with the hospitalist on call if the hospitalist that took care of you is not available. Once you are discharged, your primary care physician will handle any further medical issues. Please note that NO REFILLS for any discharge medications will  be authorized once you are discharged, as it is imperative that you return to your primary care physician (or establish a relationship with a primary care physician if you do not have one) for your aftercare needs so that they can reassess your need for medications and monitor your lab values. If you do not have a primary care physician, you can call [phone] for a physician referral.   Increase activity slowly   Complete by: As directed         Allergies as of 07/28/2024       Reactions   Morphine  And Codeine Other (See Comments)   Made him go crazy, made his insides hurt   Avelox [moxifloxacin Hcl In Nacl]    Dizziness        Medication List     STOP taking these medications    docusate sodium  100 MG capsule Commonly known as: COLACE   doxazosin  4 MG tablet Commonly known as: CARDURA    hydrochlorothiazide  25 MG tablet Commonly known as: HYDRODIURIL    losartan  50 MG tablet Commonly known as: COZAAR        TAKE these  medications    Acidophilus Caps capsule Take 1 capsule (100 mg total) by mouth 2 (two) times daily.   aluminum-magnesium hydroxide 200-200 MG/5ML suspension Take 5 mLs by mouth as needed for indigestion.   amoxicillin -clavulanate 875-125 MG tablet Commonly known as: AUGMENTIN  Take 1 tablet by mouth every 12 (twelve) hours for 6 days.   DIGESTIVE ENZYMES PO Take 1 capsule by mouth daily.   EMERGEN-C IMMUNE+ PO Take 1 tablet by mouth daily.   furosemide  20 MG tablet Commonly known as: LASIX  Take 1 tablet (20 mg total) by mouth daily.   guaiFENesin  600 MG 12 hr tablet Commonly known as: MUCINEX  Take 1 tablet (600 mg total) by mouth 2 (two) times daily.   latanoprost  0.005 % ophthalmic solution Commonly known as: XALATAN  Place 1 drop into both eyes at bedtime.   metoprolol  tartrate 50 MG tablet Commonly known as: LOPRESSOR  Take 25 mg by mouth 2 (two) times daily.   omeprazole 20 MG tablet Commonly known as: PRILOSEC OTC Take 20 mg by mouth daily.   polyethylene glycol 17 g packet Commonly known as: MIRALAX  / GLYCOLAX  Take 17 g by mouth 2 (two) times daily.   Stool Softener/Laxative 50-8.6 MG tablet Generic drug: senna-docusate Take 2 tablets by mouth 2 (two) times daily.          Contact information for follow-up providers     [facility] Follow up.   Why: Oxygen-concentrator to be delivered to the home. Contact information: Address: [address], [place], [place] [address] Phone: [phone]             Contact information for after-discharge care     Durable Medical Equipment     [facility] (DME) .   Service: Durable Medical Equipment Contact information: [address] [place]  [address] [phone]             Home Medical Care     [facility]) .   Service: Home Health Services Why: Physical Therapy, registered Nurse and Aide-office to call with  visit times. Contact information: [address] [place]  [address] [phone]                     TOTAL DISCHARGE TIME: 35 minutes  Joette Pebbles  [facility]  Pager on [email or web address]  07/29/2024, 10:36 AM    "

## 2024-07-28 NOTE — Progress Notes (Signed)
 Mobility Specialist Progress Note;    07/28/24 1103  Mobility  Activity Ambulated with assistance  Level of Assistance Contact guard assist, steadying assist  Assistive Device Front wheel walker  Distance Ambulated (ft) 70 ft  Activity Response Tolerated well  Mobility Referral Yes  Mobility visit 1 Mobility  Mobility Specialist Start Time (ACUTE ONLY) 1103  Mobility Specialist Stop Time (ACUTE ONLY) 1124  Mobility Specialist Time Calculation (min) (ACUTE ONLY) 21 min    Pt received in bed agreeable to mobility, on 4L/min with a SPO2 of 100%. No physical assistance required during ambulation, CGA for safety. Required 2L/min during ambulation, SpO2 maintained VSS. Once returned pt was left on 2L/min with an SPO2 of 97%. Bed alarm on with call bell and personal belongings in reach. All needs met.   Ricky Janeal MATSU, BS Mobility Specialist Please contact via Special Educational Needs Teacher or [employer] [phone]

## 2024-07-28 NOTE — Progress Notes (Signed)
 PT Cancellation Note  Patient Details Name: [name] MRN: [id number] DOB: 11-Dec-1928   Cancelled Treatment:    Reason Eval/Treat Not Completed: Patient declined, no reason specified (Pt and family declined. Stated they already ambulated with mobility and have no concerns or needs at this time.)  [name], DPT, CLT  Acute Rehabilitation Services Office: [phone] (Secure chat preferred)    [name] 07/28/2024, 12:20 PM

## 2024-07-28 NOTE — Progress Notes (Signed)
 "  [facility] PROGRESS NOTE   [name] FMW:[id number] DOB: April 11, 1929 DOA: 07/21/2024  PCP: Gib Charleston, MD  Brief History: 89 y.o. male with medical history significant for HTN, HLD, HFpEF, CAD s/p PCI and splenic laceration who presented to the ED for evaluation of shortness of breath and cough.  Patient tested positive for influenza.  Was also thought to have diastolic CHF.  Patient was hospitalized for further management.     Consultants: None  Procedures: None  Subjective/Interval History: Patient sitting up in the bed.  Occasional cough is present.  Denies any chest pain or shortness of breath.  Daughter is at the bedside.    Assessment/Plan:  Acute respiratory failure with hypoxia Patient presented with progressive shortness of breath found to be hypoxic requiring up to 4 L [place] This is in the setting of CHF exacerbation and acute bronchitis from influenza A infection. Will need oxygen at home which has been ordered. Respiratory status is stable for the most part.  See below.  Secondary bacterial pneumonia versus aspiration pneumonia Patient had worsening in his cough.  Oxygen saturations also noted to be lower than usual.  X-ray was done which revealed right lower lobe infiltrate.  This is either suggestive of a secondary bacterial infection or aspiration pneumonia.  Placed him on Augmentin .   Daughter mentions that patient has had issues with esophageal dysphagia previously requiring esophageal dilatation.  His respiratory status is very tenuous and he is not a candidate for endoscopic evaluation at this time.  There does not appear to be an esophageal component at this time.  Seen by SLP.  No concerns for oropharyngeal dysphagia noted at this time.  Continue current diet.  No further testing at this time. Flutter valve.  Acute on chronic diastolic HF Presented with progressive shortness of breath and leg swelling Stress test in 2017 showed EF of 57%, no  documented echo ProBNP elevated to ~2000, patient mildly overloaded on exam Patient was started on IV furosemide . Strict ins and outs and daily weights.  Monitor renal function and electrolytes closely. Patient also noted to be on metoprolol  and ARB. Echocardiogram was done which showed normal LVEF but did show grade 2 diastolic dysfunction. Patient underwent CT angiogram which did not show any PE.  Trace bilateral pleural effusions were noted. Changed over to oral furosemide .  Influenza A infection/Acute bronchitis - Patient presented with 2-3 days of shortness of breath, cough and rhinorrhea - Respiratory panel positive for influenza A - As needed Robitussin for cough and as needed DuoNeb for SOB/wheezing - Droplet precautions Patient has completed course of Tamiflu .  Tapered off prednisone .   Essential hypertension Antihypertensive doses had to be adjusted due to low blood pressures.  Blood pressure appears to have stabilized.  Continue current dose for now.  He is currently on furosemide  20 mg daily, metoprolol  12.5 mg twice daily.   Doxazosin , ARB and HCTZ have been discontinued.   Will have to tolerate a high level of blood pressure in this elderly patient.   CKD 3B Baseline of 1.3-1.5 Renal function is at baseline currently.  Constipation Has had bowel movements.  Continue bowel regimen.  To be given Dulcolax today.  DVT Prophylaxis: Lovenox  Code Status: DNR. Family Communication: Daughter being updated daily. Disposition Plan: Seen by PT.  Home health is recommended.  He does live alone.  Daughter and other family members plan to help.  Needs home oxygen.  PT to reevaluate today.  Anticipate discharge later today if  he does well with physical therapy.    Medications: Scheduled:  amoxicillin -clavulanate  1 tablet Oral Q12H   enoxaparin  (LOVENOX ) injection  40 mg Subcutaneous Q24H   furosemide   20 mg Oral Daily   guaiFENesin   600 mg Oral BID   latanoprost   1 drop Both  Eyes QHS   metoprolol  tartrate  12.5 mg Oral BID   multivitamin with minerals  1 tablet Oral Daily   pneumococcal 20-valent conjugate vaccine  0.5 mL Intramuscular Tomorrow-1000   polyethylene glycol  17 g Oral BID   saccharomyces boulardii  250 mg Oral BID   senna-docusate  2 tablet Oral BID   Continuous: PRN:acetaminophen  **OR** acetaminophen , bisacodyl , guaiFENesin , ipratropium-albuterol , ondansetron  **OR** ondansetron  (ZOFRAN ) IV, mouth rinse  Antibiotics: Anti-infectives (From admission, onward)    Start     Dose/Rate Route Frequency Ordered Stop   07/26/24 2200  amoxicillin -clavulanate (AUGMENTIN ) 875-125 MG per tablet 1 tablet  Status:  Discontinued        1 tablet Oral Every 12 hours 07/26/24 1703 07/26/24 1704   07/26/24 1800  amoxicillin -clavulanate (AUGMENTIN ) 875-125 MG per tablet 1 tablet        1 tablet Oral Every 12 hours 07/26/24 1704     07/21/24 2215  oseltamivir  (TAMIFLU ) capsule 30 mg        30 mg Oral 2 times daily 07/21/24 2209 07/26/24 0919       Objective:  Vital Signs  Vitals:   07/28/24 0013 07/28/24 0529 07/28/24 0643 07/28/24 0749  BP: 137/67 (!) 141/52    Pulse: 61 (!) 51    Resp: 18     Temp: 98.4 F (36.9 C) 98 F (36.7 C)    TempSrc: Oral Oral  Oral  SpO2: 99% 100%    Weight:   70.9 kg   Height:        Intake/Output Summary (Last 24 hours) at 07/28/2024 0916 Last data filed at 07/27/2024 1520 Gross per 24 hour  Intake 120 ml  Output 400 ml  Net -280 ml   Filed Weights   07/26/24 0611 07/27/24 0405 07/28/24 0643  Weight: 68.3 kg 72.6 kg 70.9 kg    General appearance: Awake alert.  In no distress Resp: Normal effort at rest.  Coarse breath sound bilaterally.  Few crackles in the bases right more than left. Cardio: S1-S2 is normal regular.  No S3-S4.  No rubs murmurs or bruit GI: Abdomen is soft.  Nontender nondistended.  Bowel sounds are present normal.  No masses organomegaly Extremities: No edema.  Physical deconditioning  noted. Neurologic: Alert and oriented x3.  No focal neurological deficits.    Lab Results:  Data Reviewed: I have personally reviewed following labs and reports of the imaging studies  CBC: Recent Labs  Lab 07/21/24 1120 07/21/24 1232 07/22/24 1328 07/23/24 0454 07/27/24 0318  WBC  --  8.5 9.4 8.0 9.9  NEUTROABS  --  6.9  --   --   --   HGB 11.6* 11.9* 11.4* 11.0* 10.5*  HCT 34.0* 36.4* 35.0* 32.9* 31.4*  MCV  --  98.1 96.4 95.1 96.0  PLT  --  145* 146* 140* 137*    Basic Metabolic Panel: Recent Labs  Lab 07/24/24 0438 07/25/24 0310 07/26/24 0357 07/27/24 0318 07/28/24 0342  NA 142 140 141 141 140  K 3.9 4.2 4.4 4.2 4.3  CL 103 102 103 101 101  CO2 31 30 32 32 30  GLUCOSE 93 95 94 92 91  BUN 48* 55* 53*  51* 42*  CREATININE 1.36* 1.51* 1.32* 1.24 1.18  CALCIUM  8.4* 8.7* 8.7* 8.5* 8.9  MG  --  2.0  --   --   --     GFR: Estimated Creatinine Clearance: 37.8 mL/min (by C-G formula based on SCr of 1.18 mg/dL).  Liver Function Tests: Recent Labs  Lab 07/21/24 1232 07/23/24 0454  AST 21 28  ALT 17 14  ALKPHOS 100 76  BILITOT 0.6 0.4  PROT 6.8 5.7*  ALBUMIN 4.0 3.4*    BNP (last 3 results) Recent Labs    07/21/24 1232  PROBNP 1,997.0*    Recent Results (from the past 240 hours)  Resp panel by RT-PCR (RSV, Flu A&B, Covid) Anterior Nasal Swab     Status: Abnormal   Collection Time: 07/21/24 12:17 PM   Specimen: Anterior Nasal Swab  Result Value Ref Range Status   SARS Coronavirus 2 by RT PCR NEGATIVE NEGATIVE Final   Influenza A by PCR POSITIVE (A) NEGATIVE Final   Influenza B by PCR NEGATIVE NEGATIVE Final    Comment: (NOTE) The Xpert Xpress SARS-CoV-2/FLU/RSV plus assay is intended as an aid in the diagnosis of influenza from Nasopharyngeal swab specimens and should not be used as a sole basis for treatment. Nasal washings and aspirates are unacceptable for Xpert Xpress SARS-CoV-2/FLU/RSV testing.  Fact Sheet for  Patients: bloggercourse.com  Fact Sheet for Healthcare Providers: seriousbroker.it  This test is not yet approved or cleared by the [place]  FDA and has been authorized for detection and/or diagnosis of SARS-CoV-2 by FDA under an Emergency Use Authorization (EUA). This EUA will remain in effect (meaning this test can be used) for the duration of the COVID-19 declaration under Section 564(b)(1) of the Act, 21 U.S.C. section 360bbb-3(b)(1), unless the authorization is terminated or revoked.     Resp Syncytial Virus by PCR NEGATIVE NEGATIVE Final    Comment: (NOTE) Fact Sheet for Patients: bloggercourse.com  Fact Sheet for Healthcare Providers: seriousbroker.it  This test is not yet approved or cleared by the [place]  FDA and has been authorized for detection and/or diagnosis of SARS-CoV-2 by FDA under an Emergency Use Authorization (EUA). This EUA will remain in effect (meaning this test can be used) for the duration of the COVID-19 declaration under Section 564(b)(1) of the Act, 21 U.S.C. section 360bbb-3(b)(1), unless the authorization is terminated or revoked.  Performed at [facility], [address] [place], [place] [address]       Radiology Studies: DG CHEST PORT 1 VIEW Result Date: 07/26/2024 EXAM: 1 VIEW(S) XRAY OF THE CHEST 07/26/2024 12:02:00 PM COMPARISON: 07/21/2024 CLINICAL HISTORY: Cough FINDINGS: LUNGS AND PLEURA: Right lower lobe airspace opacity. Trace right pleural effusion. No pneumothorax. HEART AND MEDIASTINUM: Atherosclerotic plaque noted. Coronary artery stent in place. No acute abnormality of the cardiac and mediastinal silhouettes. BONES AND SOFT TISSUES: No acute osseous abnormality. IMPRESSION: 1. New Right lower lobe airspace opacity  concerning for pneumonia. Electronically signed by: Norleen Boxer MD 07/26/2024 04:23 PM EST RP  Workstation: [id number]     LOS: 7 days   Joette Pebbles  [facility] Pager on [email or web address]  07/28/2024, 9:16 AM   "

## 2024-07-28 NOTE — TOC Progression Note (Addendum)
 Transition of Care [facility]) - Progression Note    Patient Details  Name: [name] MRN: [id number] Date of Birth: [birth year]  [facility] CM/SW Contact  Nola Devere Hands, RN Phone Number: 07/28/2024, 12:40 PM  Clinical Narrative:    Case manager has spoken with patient's daughter, Luke Carol concerning if oxygen concentrator has been delivered to the home.It has not. CM contacted [employer] liaison and was informed that there was a miscommunication, concentrator will be delivered to patient's home by 4:00pm. Case Manager called Luke with this update. Bedside RN Burnard is aware, patient also can not discharge until he has had BM. PTAR will be recalled after notified that pt is ready and that oxygen is delivered. Medical Necessity form has been completed.   1603: [employer] will deliver concentrator to home at 4:15pm, PTAR is arranged for 5:00pm transport. Devere Nola, RNCM   Expected Discharge Plan: Home w Home Health Services Barriers to Discharge: No Barriers Identified               Expected Discharge Plan and Services In-house Referral: NA Discharge Planning Services: CM Consult Post Acute Care Choice: Home Health Living arrangements for the past 2 months: Single Family Home Expected Discharge Date: 07/28/24               DME Arranged: Oxygen DME Agency: [employer] Date DME Agency Contacted: 07/27/24 Time DME Agency Contacted: [phone] Representative spoke with at DME Agency: hub/ [place] HH Arranged: RN, Disease Management, PT, Nurse's Aide HH Agency: [facility] Date [facility] Agency Contacted: 07/27/24 Time HH Agency Contacted: [phone] Representative spoke with at [facility] Agency: [facility]   Social Drivers of Health (SDOH) Interventions SDOH Screenings   Food Insecurity: No Food Insecurity (07/22/2024)  Housing: Low Risk (07/22/2024)  Transportation Needs: No Transportation Needs (07/22/2024)  Utilities: Not At Risk (07/22/2024)  Social Connections:  Moderately Integrated (07/22/2024)  Tobacco Use: Low Risk (07/21/2024)    Readmission Risk Interventions     No data to display

## 2024-08-05 ENCOUNTER — Ambulatory Visit: Admitting: Cardiology

## 2024-08-07 ENCOUNTER — Telehealth: Payer: Self-pay | Admitting: Cardiology

## 2024-08-07 NOTE — Telephone Encounter (Signed)
" °*  STAT* If patient is at the pharmacy, call can be transferred to refill team.   1. Which medications need to be refilled? (please list name of each medication and dose if known) furosemide  (LASIX ) 20 MG tablet    2. Would you like to learn more about the convenience, safety, & potential cost savings by using the [facility]?     3. Are you open to using the [facility] (Type [facility]. ).   4. Which pharmacy/location (including street and city if local pharmacy) is medication to be sent to? [id number] - [place],  - [address] AT [address] [facility] [address]    5. Do they need a 30 day or 90 day supply? 30 day  "

## 2024-08-08 ENCOUNTER — Other Ambulatory Visit: Payer: Self-pay | Admitting: Cardiology

## 2024-09-29 ENCOUNTER — Ambulatory Visit: Admitting: Cardiology
